# Patient Record
Sex: Female | Born: 1965 | Race: White | Hispanic: Yes | Marital: Single | Smoking: Former smoker
Health system: Southern US, Community
[De-identification: ages and names within clinical notes are randomized; demographics above are authoritative.]

## PROBLEM LIST (undated history)

## (undated) DIAGNOSIS — F419 Anxiety disorder, unspecified: Secondary | ICD-10-CM

## (undated) DIAGNOSIS — I10 Essential (primary) hypertension: Secondary | ICD-10-CM

## (undated) DIAGNOSIS — F509 Eating disorder, unspecified: Secondary | ICD-10-CM

## (undated) DIAGNOSIS — G43909 Migraine, unspecified, not intractable, without status migrainosus: Secondary | ICD-10-CM

## (undated) DIAGNOSIS — F319 Bipolar disorder, unspecified: Secondary | ICD-10-CM

## (undated) DIAGNOSIS — M5416 Radiculopathy, lumbar region: Secondary | ICD-10-CM

## (undated) HISTORY — PX: OTHER SURGICAL HISTORY: SHX169

## (undated) HISTORY — PX: ABDOMINAL HYSTERECTOMY: SHX81

## (undated) SURGERY — Surgical Case
Anesthesia: *Unknown

---

## 1997-12-24 ENCOUNTER — Emergency Department (HOSPITAL_COMMUNITY): Admission: EM | Admit: 1997-12-24 | Discharge: 1997-12-24 | Payer: Self-pay | Admitting: Emergency Medicine

## 1999-01-03 ENCOUNTER — Emergency Department (HOSPITAL_COMMUNITY): Admission: EM | Admit: 1999-01-03 | Discharge: 1999-01-03 | Payer: Self-pay | Admitting: Emergency Medicine

## 1999-01-03 ENCOUNTER — Encounter: Payer: Self-pay | Admitting: Emergency Medicine

## 1999-05-09 ENCOUNTER — Encounter: Payer: Self-pay | Admitting: Emergency Medicine

## 1999-05-09 ENCOUNTER — Emergency Department (HOSPITAL_COMMUNITY): Admission: EM | Admit: 1999-05-09 | Discharge: 1999-05-09 | Payer: Self-pay | Admitting: Emergency Medicine

## 1999-11-12 ENCOUNTER — Emergency Department (HOSPITAL_COMMUNITY): Admission: EM | Admit: 1999-11-12 | Discharge: 1999-11-12 | Payer: Self-pay | Admitting: Emergency Medicine

## 1999-11-12 ENCOUNTER — Encounter: Payer: Self-pay | Admitting: Emergency Medicine

## 1999-11-22 ENCOUNTER — Emergency Department (HOSPITAL_COMMUNITY): Admission: EM | Admit: 1999-11-22 | Discharge: 1999-11-22 | Payer: Self-pay | Admitting: Emergency Medicine

## 1999-12-30 ENCOUNTER — Emergency Department (HOSPITAL_COMMUNITY): Admission: EM | Admit: 1999-12-30 | Discharge: 1999-12-30 | Payer: Self-pay | Admitting: Emergency Medicine

## 2000-02-07 ENCOUNTER — Emergency Department (HOSPITAL_COMMUNITY): Admission: EM | Admit: 2000-02-07 | Discharge: 2000-02-07 | Payer: Self-pay | Admitting: Podiatry

## 2000-02-07 ENCOUNTER — Encounter: Payer: Self-pay | Admitting: *Deleted

## 2000-02-25 ENCOUNTER — Emergency Department (HOSPITAL_COMMUNITY): Admission: EM | Admit: 2000-02-25 | Discharge: 2000-02-25 | Payer: Self-pay | Admitting: Emergency Medicine

## 2000-02-26 ENCOUNTER — Emergency Department (HOSPITAL_COMMUNITY): Admission: EM | Admit: 2000-02-26 | Discharge: 2000-02-26 | Payer: Self-pay | Admitting: Emergency Medicine

## 2000-02-26 ENCOUNTER — Encounter: Payer: Self-pay | Admitting: Emergency Medicine

## 2000-10-27 ENCOUNTER — Encounter: Admission: RE | Admit: 2000-10-27 | Discharge: 2000-10-27 | Payer: Self-pay | Admitting: Family Medicine

## 2000-10-27 ENCOUNTER — Other Ambulatory Visit: Admission: RE | Admit: 2000-10-27 | Discharge: 2000-10-27 | Payer: Self-pay | Admitting: Family Medicine

## 2000-11-10 ENCOUNTER — Encounter: Payer: Self-pay | Admitting: Otolaryngology

## 2000-11-10 ENCOUNTER — Encounter: Admission: RE | Admit: 2000-11-10 | Discharge: 2000-11-10 | Payer: Self-pay | Admitting: Otolaryngology

## 2000-11-12 ENCOUNTER — Encounter: Admission: RE | Admit: 2000-11-12 | Discharge: 2000-11-12 | Payer: Self-pay | Admitting: Family Medicine

## 2000-12-21 ENCOUNTER — Encounter: Payer: Self-pay | Admitting: Neurosurgery

## 2000-12-21 ENCOUNTER — Ambulatory Visit (HOSPITAL_COMMUNITY): Admission: RE | Admit: 2000-12-21 | Discharge: 2000-12-21 | Payer: Self-pay | Admitting: Neurosurgery

## 2001-01-06 ENCOUNTER — Encounter: Payer: Self-pay | Admitting: *Deleted

## 2001-01-08 ENCOUNTER — Inpatient Hospital Stay (HOSPITAL_COMMUNITY): Admission: RE | Admit: 2001-01-08 | Discharge: 2001-01-09 | Payer: Self-pay | Admitting: Otolaryngology

## 2001-01-08 ENCOUNTER — Encounter (INDEPENDENT_AMBULATORY_CARE_PROVIDER_SITE_OTHER): Payer: Self-pay | Admitting: Specialist

## 2001-01-30 ENCOUNTER — Encounter: Admission: RE | Admit: 2001-01-30 | Discharge: 2001-01-30 | Payer: Self-pay | Admitting: Family Medicine

## 2001-04-26 ENCOUNTER — Ambulatory Visit (HOSPITAL_COMMUNITY): Admission: RE | Admit: 2001-04-26 | Discharge: 2001-04-26 | Payer: Self-pay | Admitting: Neurological Surgery

## 2001-04-26 ENCOUNTER — Encounter: Payer: Self-pay | Admitting: Neurological Surgery

## 2001-06-04 ENCOUNTER — Encounter: Admission: RE | Admit: 2001-06-04 | Discharge: 2001-06-04 | Payer: Self-pay | Admitting: Neurosurgery

## 2001-06-04 ENCOUNTER — Encounter: Payer: Self-pay | Admitting: Neurosurgery

## 2001-06-17 ENCOUNTER — Encounter: Admission: RE | Admit: 2001-06-17 | Discharge: 2001-06-17 | Payer: Self-pay | Admitting: Neurosurgery

## 2001-06-17 ENCOUNTER — Encounter: Payer: Self-pay | Admitting: Neurosurgery

## 2001-09-29 ENCOUNTER — Encounter: Admission: RE | Admit: 2001-09-29 | Discharge: 2001-09-29 | Payer: Self-pay | Admitting: Sports Medicine

## 2001-10-28 ENCOUNTER — Emergency Department (HOSPITAL_COMMUNITY): Admission: EM | Admit: 2001-10-28 | Discharge: 2001-10-29 | Payer: Self-pay | Admitting: Emergency Medicine

## 2001-10-29 ENCOUNTER — Encounter: Payer: Self-pay | Admitting: Emergency Medicine

## 2001-11-05 ENCOUNTER — Inpatient Hospital Stay (HOSPITAL_COMMUNITY): Admission: EM | Admit: 2001-11-05 | Discharge: 2001-11-11 | Payer: Self-pay | Admitting: Psychiatry

## 2001-12-02 ENCOUNTER — Encounter: Admission: RE | Admit: 2001-12-02 | Discharge: 2001-12-02 | Payer: Self-pay | Admitting: Radiology

## 2002-01-27 ENCOUNTER — Emergency Department (HOSPITAL_COMMUNITY): Admission: EM | Admit: 2002-01-27 | Discharge: 2002-01-27 | Payer: Self-pay | Admitting: Emergency Medicine

## 2002-05-24 ENCOUNTER — Encounter: Admission: RE | Admit: 2002-05-24 | Discharge: 2002-05-24 | Payer: Self-pay | Admitting: Family Medicine

## 2002-05-25 ENCOUNTER — Emergency Department (HOSPITAL_COMMUNITY): Admission: EM | Admit: 2002-05-25 | Discharge: 2002-05-26 | Payer: Self-pay | Admitting: Emergency Medicine

## 2002-06-15 ENCOUNTER — Encounter: Payer: Self-pay | Admitting: Emergency Medicine

## 2002-06-15 ENCOUNTER — Emergency Department (HOSPITAL_COMMUNITY): Admission: EM | Admit: 2002-06-15 | Discharge: 2002-06-15 | Payer: Self-pay | Admitting: Emergency Medicine

## 2002-06-23 ENCOUNTER — Emergency Department (HOSPITAL_COMMUNITY): Admission: EM | Admit: 2002-06-23 | Discharge: 2002-06-23 | Payer: Self-pay | Admitting: *Deleted

## 2002-09-21 ENCOUNTER — Other Ambulatory Visit: Admission: RE | Admit: 2002-09-21 | Discharge: 2002-09-21 | Payer: Self-pay | Admitting: Family Medicine

## 2002-09-21 ENCOUNTER — Encounter: Admission: RE | Admit: 2002-09-21 | Discharge: 2002-09-21 | Payer: Self-pay | Admitting: Family Medicine

## 2002-09-21 ENCOUNTER — Encounter (INDEPENDENT_AMBULATORY_CARE_PROVIDER_SITE_OTHER): Payer: Self-pay | Admitting: *Deleted

## 2002-09-22 ENCOUNTER — Encounter: Admission: RE | Admit: 2002-09-22 | Discharge: 2002-09-22 | Payer: Self-pay | Admitting: Family Medicine

## 2002-10-07 ENCOUNTER — Encounter: Admission: RE | Admit: 2002-10-07 | Discharge: 2002-10-07 | Payer: Self-pay | Admitting: Family Medicine

## 2003-05-23 ENCOUNTER — Encounter: Admission: RE | Admit: 2003-05-23 | Discharge: 2003-05-23 | Payer: Self-pay | Admitting: Family Medicine

## 2003-06-15 ENCOUNTER — Encounter: Admission: RE | Admit: 2003-06-15 | Discharge: 2003-06-15 | Payer: Self-pay | Admitting: Family Medicine

## 2004-02-10 ENCOUNTER — Ambulatory Visit (HOSPITAL_COMMUNITY): Admission: RE | Admit: 2004-02-10 | Discharge: 2004-02-10 | Payer: Self-pay | Admitting: Neurosurgery

## 2004-06-28 ENCOUNTER — Ambulatory Visit: Payer: Self-pay | Admitting: Family Medicine

## 2004-07-04 ENCOUNTER — Emergency Department (HOSPITAL_COMMUNITY): Admission: EM | Admit: 2004-07-04 | Discharge: 2004-07-04 | Payer: Self-pay | Admitting: Family Medicine

## 2004-08-08 ENCOUNTER — Encounter: Admission: RE | Admit: 2004-08-08 | Discharge: 2004-08-08 | Payer: Self-pay | Admitting: Orthopaedic Surgery

## 2004-08-21 ENCOUNTER — Encounter: Admission: RE | Admit: 2004-08-21 | Discharge: 2004-08-21 | Payer: Self-pay | Admitting: Orthopaedic Surgery

## 2004-10-04 ENCOUNTER — Emergency Department (HOSPITAL_COMMUNITY): Admission: EM | Admit: 2004-10-04 | Discharge: 2004-10-04 | Payer: Self-pay | Admitting: Emergency Medicine

## 2004-11-14 ENCOUNTER — Encounter
Admission: RE | Admit: 2004-11-14 | Discharge: 2005-02-12 | Payer: Self-pay | Admitting: Physical Medicine & Rehabilitation

## 2004-11-16 ENCOUNTER — Ambulatory Visit: Payer: Self-pay | Admitting: Physical Medicine & Rehabilitation

## 2004-12-24 ENCOUNTER — Other Ambulatory Visit: Admission: RE | Admit: 2004-12-24 | Discharge: 2004-12-24 | Payer: Self-pay | Admitting: Family Medicine

## 2004-12-24 ENCOUNTER — Ambulatory Visit: Payer: Self-pay | Admitting: Sports Medicine

## 2005-01-03 ENCOUNTER — Ambulatory Visit: Payer: Self-pay | Admitting: Family Medicine

## 2005-01-11 ENCOUNTER — Ambulatory Visit: Payer: Self-pay | Admitting: Family Medicine

## 2005-03-13 ENCOUNTER — Ambulatory Visit: Payer: Self-pay | Admitting: Physical Medicine & Rehabilitation

## 2005-03-13 ENCOUNTER — Encounter
Admission: RE | Admit: 2005-03-13 | Discharge: 2005-06-11 | Payer: Self-pay | Admitting: Physical Medicine & Rehabilitation

## 2005-06-07 ENCOUNTER — Ambulatory Visit: Payer: Self-pay | Admitting: Physical Medicine & Rehabilitation

## 2005-06-07 ENCOUNTER — Ambulatory Visit: Payer: Self-pay | Admitting: Sports Medicine

## 2005-09-07 ENCOUNTER — Encounter
Admission: RE | Admit: 2005-09-07 | Discharge: 2005-12-06 | Payer: Self-pay | Admitting: Physical Medicine & Rehabilitation

## 2005-10-07 ENCOUNTER — Ambulatory Visit: Payer: Self-pay | Admitting: Physical Medicine & Rehabilitation

## 2006-01-20 ENCOUNTER — Encounter: Admission: RE | Admit: 2006-01-20 | Discharge: 2006-01-20 | Payer: Self-pay | Admitting: Sports Medicine

## 2006-01-24 ENCOUNTER — Ambulatory Visit: Payer: Self-pay | Admitting: Physical Medicine & Rehabilitation

## 2006-01-24 ENCOUNTER — Encounter
Admission: RE | Admit: 2006-01-24 | Discharge: 2006-04-24 | Payer: Self-pay | Admitting: Physical Medicine & Rehabilitation

## 2006-05-22 ENCOUNTER — Encounter
Admission: RE | Admit: 2006-05-22 | Discharge: 2006-08-20 | Payer: Self-pay | Admitting: Physical Medicine & Rehabilitation

## 2006-06-06 ENCOUNTER — Ambulatory Visit: Payer: Self-pay | Admitting: Physical Medicine & Rehabilitation

## 2006-07-09 ENCOUNTER — Ambulatory Visit: Payer: Self-pay | Admitting: Family Medicine

## 2006-07-09 ENCOUNTER — Ambulatory Visit (HOSPITAL_COMMUNITY): Admission: RE | Admit: 2006-07-09 | Discharge: 2006-07-09 | Payer: Self-pay | Admitting: Family Medicine

## 2006-07-31 ENCOUNTER — Ambulatory Visit: Payer: Self-pay | Admitting: Sports Medicine

## 2006-09-19 ENCOUNTER — Encounter (INDEPENDENT_AMBULATORY_CARE_PROVIDER_SITE_OTHER): Payer: Self-pay | Admitting: *Deleted

## 2006-09-19 LAB — CONVERTED CEMR LAB

## 2006-10-03 ENCOUNTER — Encounter (INDEPENDENT_AMBULATORY_CARE_PROVIDER_SITE_OTHER): Payer: Self-pay | Admitting: Family Medicine

## 2006-10-03 ENCOUNTER — Other Ambulatory Visit: Admission: RE | Admit: 2006-10-03 | Discharge: 2006-10-03 | Payer: Self-pay | Admitting: Family Medicine

## 2006-10-03 ENCOUNTER — Ambulatory Visit: Payer: Self-pay | Admitting: Family Medicine

## 2006-10-03 LAB — CONVERTED CEMR LAB
Chlamydia, DNA Probe: NEGATIVE
GC Probe Amp, Genital: NEGATIVE

## 2006-10-16 DIAGNOSIS — F17200 Nicotine dependence, unspecified, uncomplicated: Secondary | ICD-10-CM | POA: Insufficient documentation

## 2006-10-16 DIAGNOSIS — I10 Essential (primary) hypertension: Secondary | ICD-10-CM

## 2006-10-16 DIAGNOSIS — F319 Bipolar disorder, unspecified: Secondary | ICD-10-CM | POA: Insufficient documentation

## 2006-10-17 ENCOUNTER — Encounter (INDEPENDENT_AMBULATORY_CARE_PROVIDER_SITE_OTHER): Payer: Self-pay | Admitting: *Deleted

## 2006-10-21 ENCOUNTER — Encounter: Admission: RE | Admit: 2006-10-21 | Discharge: 2006-10-21 | Payer: Self-pay | Admitting: Sports Medicine

## 2006-12-01 ENCOUNTER — Ambulatory Visit: Payer: Self-pay | Admitting: Physical Medicine & Rehabilitation

## 2006-12-01 ENCOUNTER — Encounter
Admission: RE | Admit: 2006-12-01 | Discharge: 2007-03-01 | Payer: Self-pay | Admitting: Physical Medicine & Rehabilitation

## 2007-03-24 ENCOUNTER — Ambulatory Visit: Payer: Self-pay | Admitting: Physical Medicine & Rehabilitation

## 2007-03-24 ENCOUNTER — Encounter
Admission: RE | Admit: 2007-03-24 | Discharge: 2007-05-19 | Payer: Self-pay | Admitting: Physical Medicine & Rehabilitation

## 2007-05-08 ENCOUNTER — Ambulatory Visit: Payer: Self-pay | Admitting: Family Medicine

## 2007-07-10 ENCOUNTER — Ambulatory Visit: Payer: Self-pay | Admitting: Physical Medicine & Rehabilitation

## 2007-07-10 ENCOUNTER — Encounter
Admission: RE | Admit: 2007-07-10 | Discharge: 2007-07-14 | Payer: Self-pay | Admitting: Physical Medicine & Rehabilitation

## 2007-08-26 ENCOUNTER — Ambulatory Visit: Payer: Self-pay | Admitting: Family Medicine

## 2007-09-07 ENCOUNTER — Ambulatory Visit: Payer: Self-pay | Admitting: Family Medicine

## 2007-09-29 ENCOUNTER — Encounter: Payer: Self-pay | Admitting: *Deleted

## 2007-10-01 ENCOUNTER — Ambulatory Visit: Payer: Self-pay | Admitting: Family Medicine

## 2007-11-02 ENCOUNTER — Encounter
Admission: RE | Admit: 2007-11-02 | Discharge: 2008-01-31 | Payer: Self-pay | Admitting: Physical Medicine & Rehabilitation

## 2007-11-09 ENCOUNTER — Encounter (INDEPENDENT_AMBULATORY_CARE_PROVIDER_SITE_OTHER): Payer: Self-pay | Admitting: Family Medicine

## 2007-11-09 ENCOUNTER — Ambulatory Visit: Payer: Self-pay | Admitting: Sports Medicine

## 2007-11-09 ENCOUNTER — Other Ambulatory Visit: Admission: RE | Admit: 2007-11-09 | Discharge: 2007-11-09 | Payer: Self-pay | Admitting: Family Medicine

## 2007-11-09 LAB — CONVERTED CEMR LAB
ALT: 15 units/L (ref 0–35)
AST: 15 units/L (ref 0–37)
Albumin: 4.7 g/dL (ref 3.5–5.2)
Alkaline Phosphatase: 58 units/L (ref 39–117)
BUN: 15 mg/dL (ref 6–23)
Beta hcg, urine, semiquantitative: NEGATIVE
CO2: 21 meq/L (ref 19–32)
Calcium: 9.2 mg/dL (ref 8.4–10.5)
Chloride: 104 meq/L (ref 96–112)
Cholesterol: 186 mg/dL (ref 0–200)
Creatinine, Ser: 0.95 mg/dL (ref 0.40–1.20)
Glucose, Bld: 93 mg/dL (ref 70–99)
HCT: 39.7 % (ref 36.0–46.0)
HDL: 66 mg/dL (ref >39)
Hemoglobin: 14.3 g/dL (ref 12.0–15.0)
LDL Cholesterol: 100 mg/dL — ABNORMAL HIGH (ref 0–99)
MCHC: 36 g/dL (ref 30.0–36.0)
MCV: 93.4 fL (ref 78.0–100.0)
Platelets: 262 10*3/uL (ref 150–400)
Potassium: 3.9 meq/L (ref 3.5–5.3)
RBC: 4.25 M/uL (ref 3.87–5.11)
RDW: 12.7 % (ref 11.5–15.5)
Sodium: 138 meq/L (ref 135–145)
TSH: 1.309 microintl units/mL (ref 0.350–5.50)
Total Bilirubin: 0.5 mg/dL (ref 0.3–1.2)
Total CHOL/HDL Ratio: 2.8
Total Protein: 7.1 g/dL (ref 6.0–8.3)
Triglycerides: 100 mg/dL (ref <150)
VLDL: 20 mg/dL (ref 0–40)
WBC: 9 10*3/uL (ref 4.0–10.5)

## 2007-11-10 ENCOUNTER — Encounter (INDEPENDENT_AMBULATORY_CARE_PROVIDER_SITE_OTHER): Payer: Self-pay | Admitting: Family Medicine

## 2007-11-11 ENCOUNTER — Encounter (INDEPENDENT_AMBULATORY_CARE_PROVIDER_SITE_OTHER): Payer: Self-pay | Admitting: Family Medicine

## 2007-11-26 ENCOUNTER — Ambulatory Visit: Payer: Self-pay | Admitting: Family Medicine

## 2007-11-26 ENCOUNTER — Telehealth (INDEPENDENT_AMBULATORY_CARE_PROVIDER_SITE_OTHER): Payer: Self-pay | Admitting: Family Medicine

## 2007-12-06 ENCOUNTER — Emergency Department (HOSPITAL_COMMUNITY): Admission: EM | Admit: 2007-12-06 | Discharge: 2007-12-06 | Payer: Self-pay | Admitting: Emergency Medicine

## 2007-12-09 ENCOUNTER — Encounter: Payer: Self-pay | Admitting: *Deleted

## 2007-12-10 ENCOUNTER — Ambulatory Visit: Payer: Self-pay | Admitting: Physical Medicine & Rehabilitation

## 2007-12-15 ENCOUNTER — Encounter: Payer: Self-pay | Admitting: Family Medicine

## 2008-03-21 ENCOUNTER — Encounter (INDEPENDENT_AMBULATORY_CARE_PROVIDER_SITE_OTHER): Payer: Self-pay | Admitting: *Deleted

## 2008-03-24 ENCOUNTER — Ambulatory Visit: Payer: Self-pay | Admitting: Family Medicine

## 2008-03-24 ENCOUNTER — Encounter
Admission: RE | Admit: 2008-03-24 | Discharge: 2008-03-28 | Payer: Self-pay | Admitting: Physical Medicine & Rehabilitation

## 2008-03-28 ENCOUNTER — Ambulatory Visit: Payer: Self-pay | Admitting: Physical Medicine & Rehabilitation

## 2008-04-19 ENCOUNTER — Encounter (INDEPENDENT_AMBULATORY_CARE_PROVIDER_SITE_OTHER): Payer: Self-pay | Admitting: *Deleted

## 2008-05-26 ENCOUNTER — Encounter: Payer: Self-pay | Admitting: Family Medicine

## 2008-05-26 ENCOUNTER — Ambulatory Visit: Payer: Self-pay | Admitting: Family Medicine

## 2008-05-26 ENCOUNTER — Other Ambulatory Visit: Admission: RE | Admit: 2008-05-26 | Discharge: 2008-05-26 | Payer: Self-pay | Admitting: Family Medicine

## 2008-05-26 DIAGNOSIS — F1910 Other psychoactive substance abuse, uncomplicated: Secondary | ICD-10-CM | POA: Insufficient documentation

## 2008-05-31 ENCOUNTER — Encounter: Payer: Self-pay | Admitting: Family Medicine

## 2008-05-31 LAB — CONVERTED CEMR LAB: Pap Smear: NORMAL

## 2008-07-13 ENCOUNTER — Encounter
Admission: RE | Admit: 2008-07-13 | Discharge: 2008-07-18 | Payer: Self-pay | Admitting: Physical Medicine & Rehabilitation

## 2008-07-18 ENCOUNTER — Ambulatory Visit: Payer: Self-pay | Admitting: Physical Medicine & Rehabilitation

## 2008-08-29 ENCOUNTER — Other Ambulatory Visit: Payer: Self-pay

## 2008-08-30 ENCOUNTER — Inpatient Hospital Stay (HOSPITAL_COMMUNITY): Admission: AD | Admit: 2008-08-30 | Discharge: 2008-09-04 | Payer: Self-pay | Admitting: Psychiatry

## 2008-08-30 ENCOUNTER — Ambulatory Visit: Payer: Self-pay | Admitting: Psychiatry

## 2008-09-07 ENCOUNTER — Emergency Department (HOSPITAL_COMMUNITY): Admission: EM | Admit: 2008-09-07 | Discharge: 2008-09-07 | Payer: Self-pay | Admitting: Emergency Medicine

## 2008-09-07 ENCOUNTER — Encounter: Payer: Self-pay | Admitting: Family Medicine

## 2008-09-16 ENCOUNTER — Ambulatory Visit: Payer: Self-pay | Admitting: Family Medicine

## 2008-12-28 ENCOUNTER — Telehealth: Payer: Self-pay | Admitting: *Deleted

## 2009-01-24 ENCOUNTER — Ambulatory Visit: Payer: Self-pay | Admitting: Family Medicine

## 2009-02-06 ENCOUNTER — Telehealth: Payer: Self-pay | Admitting: *Deleted

## 2009-02-13 ENCOUNTER — Encounter: Admission: RE | Admit: 2009-02-13 | Discharge: 2009-02-13 | Payer: Self-pay | Admitting: Orthopaedic Surgery

## 2009-02-22 ENCOUNTER — Encounter: Admission: RE | Admit: 2009-02-22 | Discharge: 2009-03-23 | Payer: Self-pay | Admitting: Orthopaedic Surgery

## 2009-03-06 ENCOUNTER — Encounter: Admission: RE | Admit: 2009-03-06 | Discharge: 2009-03-06 | Payer: Self-pay | Admitting: Orthopaedic Surgery

## 2009-03-09 ENCOUNTER — Encounter: Payer: Self-pay | Admitting: *Deleted

## 2009-05-12 ENCOUNTER — Encounter: Admission: RE | Admit: 2009-05-12 | Discharge: 2009-05-12 | Payer: Self-pay | Admitting: Orthopaedic Surgery

## 2009-05-17 ENCOUNTER — Encounter: Admission: RE | Admit: 2009-05-17 | Discharge: 2009-05-17 | Payer: Self-pay | Admitting: Orthopaedic Surgery

## 2009-06-28 ENCOUNTER — Encounter: Admission: RE | Admit: 2009-06-28 | Discharge: 2009-06-28 | Payer: Self-pay | Admitting: Family Medicine

## 2009-07-11 ENCOUNTER — Encounter: Payer: Self-pay | Admitting: Family Medicine

## 2009-07-11 ENCOUNTER — Ambulatory Visit: Payer: Self-pay | Admitting: Family Medicine

## 2009-07-11 LAB — CONVERTED CEMR LAB
Glucose, Urine, Semiquant: NEGATIVE
Nitrite: NEGATIVE
Urobilinogen, UA: 0.2
WBC Urine, dipstick: NEGATIVE

## 2009-07-17 LAB — CONVERTED CEMR LAB
Alkaline Phosphatase: 55 units/L (ref 39–117)
BUN: 8 mg/dL (ref 6–23)
Basophils Absolute: 0.1 10*3/uL (ref 0.0–0.1)
Basophils Relative: 1 % (ref 0–1)
CRP: 0 mg/dL (ref <0.6)
Creatinine, Ser: 0.83 mg/dL (ref 0.40–1.20)
Glucose, Bld: 79 mg/dL (ref 70–99)
MCHC: 35 g/dL (ref 30.0–36.0)
Monocytes Relative: 6 % (ref 3–12)
Neutro Abs: 4.1 10*3/uL (ref 1.7–7.7)
Neutrophils Relative %: 59 % (ref 43–77)
Prothrombin Time: 12.9 s (ref 11.6–15.2)
RBC: 3.81 M/uL — ABNORMAL LOW (ref 3.87–5.11)
Total Bilirubin: 0.3 mg/dL (ref 0.3–1.2)

## 2009-07-20 ENCOUNTER — Ambulatory Visit: Payer: Self-pay | Admitting: Family Medicine

## 2009-07-20 ENCOUNTER — Encounter: Payer: Self-pay | Admitting: Family Medicine

## 2009-07-24 LAB — CONVERTED CEMR LAB
Anti Nuclear Antibody(ANA): NEGATIVE
TSH: 1.244 microintl units/mL (ref 0.350–4.500)

## 2009-09-06 ENCOUNTER — Ambulatory Visit: Payer: Self-pay | Admitting: Family Medicine

## 2009-09-06 DIAGNOSIS — M5126 Other intervertebral disc displacement, lumbar region: Secondary | ICD-10-CM

## 2009-09-06 DIAGNOSIS — G43909 Migraine, unspecified, not intractable, without status migrainosus: Secondary | ICD-10-CM | POA: Insufficient documentation

## 2009-09-07 ENCOUNTER — Encounter (INDEPENDENT_AMBULATORY_CARE_PROVIDER_SITE_OTHER): Payer: Self-pay | Admitting: *Deleted

## 2009-09-11 ENCOUNTER — Telehealth (INDEPENDENT_AMBULATORY_CARE_PROVIDER_SITE_OTHER): Payer: Self-pay | Admitting: *Deleted

## 2009-09-12 ENCOUNTER — Telehealth: Payer: Self-pay | Admitting: Family Medicine

## 2009-09-15 ENCOUNTER — Encounter: Payer: Self-pay | Admitting: *Deleted

## 2009-10-04 ENCOUNTER — Telehealth: Payer: Self-pay | Admitting: Family Medicine

## 2009-10-11 ENCOUNTER — Emergency Department (HOSPITAL_COMMUNITY): Admission: EM | Admit: 2009-10-11 | Discharge: 2009-10-11 | Payer: Self-pay | Admitting: Family Medicine

## 2009-10-27 ENCOUNTER — Ambulatory Visit: Payer: Self-pay | Admitting: Family Medicine

## 2009-11-28 ENCOUNTER — Ambulatory Visit: Payer: Self-pay | Admitting: Family Medicine

## 2009-12-27 ENCOUNTER — Ambulatory Visit: Payer: Self-pay | Admitting: Family Medicine

## 2009-12-27 ENCOUNTER — Encounter: Payer: Self-pay | Admitting: Psychology

## 2010-01-10 ENCOUNTER — Encounter: Payer: Self-pay | Admitting: Family Medicine

## 2010-01-25 ENCOUNTER — Encounter: Payer: Self-pay | Admitting: Family Medicine

## 2010-03-05 ENCOUNTER — Ambulatory Visit: Payer: Self-pay | Admitting: Family Medicine

## 2010-04-04 ENCOUNTER — Ambulatory Visit: Payer: Self-pay | Admitting: Family Medicine

## 2010-04-21 ENCOUNTER — Encounter: Payer: Self-pay | Admitting: Family Medicine

## 2010-04-21 DIAGNOSIS — J45909 Unspecified asthma, uncomplicated: Secondary | ICD-10-CM | POA: Insufficient documentation

## 2010-06-29 ENCOUNTER — Encounter: Admission: RE | Admit: 2010-06-29 | Discharge: 2010-06-29 | Payer: Self-pay | Admitting: Sports Medicine

## 2010-07-18 ENCOUNTER — Encounter: Payer: Self-pay | Admitting: Family Medicine

## 2010-09-09 ENCOUNTER — Encounter: Payer: Self-pay | Admitting: Neurosurgery

## 2010-09-16 ENCOUNTER — Encounter: Payer: Self-pay | Admitting: Family Medicine

## 2010-09-16 NOTE — Progress Notes (Unsigned)
    There were no vitals taken for this visit. Pelvic Exam: {pelvic exam:16852::"cervix normal in appearance","external genitalia normal","vagina normal without discharge"}. Pap smear obtained.   Assessment:    Screening pap smear.   Plan:   Follow up in {1-10:13787::"1"} {times; unit:10146::"year"}, or as indicated by Pap results.   Patient ID: ,     DOB: ,    MRN:   HPI Connie Black is a 45 y.o. woman who comes in today for a  pap smear only. Her most recent annual exam was on ***. Her most recent Pap smear was on *** and showed {pap results:16707::"no abnormalities"}. Previous abnormal Pap smears: {yes***/no:17258}. Contraception: {method:5051}  {Common ambulatory SmartLinks:19316}   Patient is here for Pap smear.    Review of Systems  HENT: Positive for hearing loss, neck stiffness and ear discharge.        Objective:   Physical Exam  Cardiovascular:  No murmur heard. Pulmonary/Chest: She has rales. She exhibits no tenderness.     Assessment & Plan:

## 2010-09-19 ENCOUNTER — Ambulatory Visit: Payer: Self-pay | Admitting: Family Medicine

## 2010-09-19 ENCOUNTER — Ambulatory Visit: Admit: 2010-09-19 | Payer: Self-pay

## 2010-09-20 NOTE — Miscellaneous (Signed)
Summary: Problem list     Past History:  Past Medical History: Asthma triggers: pollen, chemical odors, weather,  Past problems: -H/o chlamydia, -H/o cocaine & alcohol addiction, -H/o GERD  -Herniated disk L5/S1,  -Hypokalemia likely due to HCTZ 25 mg daily--pt has, increased potassium in diet w/ resolution(5/06) PSYCH  -Pt reports h/o schizophrenia, depression also  -h/o dissociative identity disorder  -H/o bulemia  -H/o multiple suicide attempts (x 5),  -H/o self-mutilation,   -self-admit to [facility] 1/10 x 1 week -h/o constipation (12/2009) -h/o insomnia (04/2007) -h/o low back pain (09/2006)

## 2010-09-20 NOTE — Progress Notes (Signed)
Summary: phnmsg  Phone Note Call from Patient Call back at Home Phone [phone]   Caller: Patient Summary of Call: wants to know about referral Initial call taken by: De Nurse,  October 04, 2009 1:40 PM  Follow-up for Phone Call         [facility] declined to see patient . will forward message to MD. Follow-up by: Theresia Lo RN,  October 04, 2009 1:49 PM  Additional Follow-up for Phone Call Additional follow up Details #1::        please call pt to advise. thanks.  Additional Follow-up by: Myrtie Soman  MD,  October 04, 2009 2:31 PM    Additional Follow-up for Phone Call Additional follow up Details #2::    patient notified. She will call back  to schedule appointment with Dr. Rexene Alberts Follow-up by: Theresia Lo RN,  October 04, 2009 5:01 PM

## 2010-09-20 NOTE — Assessment & Plan Note (Signed)
Summary: Behavioral Medicine Student Consultation   Primary Care Provider:  Myrtie Soman  MD   History of Present Illness: Patient spoke with behavioral medicine student Edison Pace) regarding smoking cessation.  Patient currently smokes approx. 2 cigarettes per day.  In the past she she smoked 1/2 to 1 pack per day.  Patient reported that she smokes, primarily, as a means of relieving stress and to suppress weight gain.  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  She reported seeing a therapist 2 times/week until recently (Medicaid stopped paying) to work on problems associated with her diagnosis of dissociative identity disorder.  She plans to start seeing her therapist again soon.    Allergies: 1)  ! Morphine   Impression & Recommendations:  Problem # 1:  TOBACCO DEPENDENCE (ICD-305.1) Patient appears to be in the contemplative stage of change regarding smoking cessation.  She was able to identify both benefits (e.g., asthma, wants to be able to play with grandchildren) and barriers (e.g., smoking is stress reducer, weight gain) to stopping smoking.  She has taken action in that she has significantly reduced the daily amount that she smokes; however, she said that the amount she smokes typically increases again in the summer.  Patient was able to identify other means of stress reduction (e.g., writing).  Student suggested that she try some of these alternative means of reducing stress in the future.  Student also suggested that patient talk to her therapist about learning alternative coping techniques, given that patient reported not having learned any previously.  Patient seemed confident that she would try alternative means of stress reduction, but less confident that she would quit smoking entirely.  Student or physician should monitor patient's smoking habits closely over the summer, in particular, given her report of smoking more during this  time of year.  They may also follow-up regarding her efforts to learn alternative coping techniques for stress, anxiety, et Karie Soda.  Armida Sans, Haroldine Laws Behavioral Medicine Student 12/27/2009  Complete Medication List: 1)  Qvar 80 Mcg/act Aers (Beclomethasone dipropionate) .... 2 puffs two times a day for asthma; disp qs for 1 month 2)  Albuterol 90 Mcg/act Aers (Albuterol) .... Inhale 2 puff using inhaler every six hours as needed 3)  Hydrochlorothiazide 25 Mg Tabs (Hydrochlorothiazide) .... Take 1 tablet by mouth once a day 4)  Seroquel 200 Mg Tabs (Quetiapine fumarate) .... 3 tabs by mouth at night 5)  Enalapril Maleate 5 Mg Tabs (Enalapril maleate) .... One by mouth daily 6)  Haldol 2 Mg  .... One by mouth qam 7)  Vistaril 50 Mg Caps (Hydroxyzine pamoate) .... One pill every 6 hours as needed 8)  Topamax 50 Mg Tabs (Topiramate) .... One tab by mouth at night to prevent migraines 9)  Ibuprofen 800 Mg Tabs (Ibuprofen) .... One by mouth every 8 hours as needed for migraine pain; take with food 10)  Flexeril 5 Mg Tabs (Cyclobenzaprine hcl) .... One tab by mouth every 8 hours as needed for migraine pain 11)  Permethrin 5 % Crea (Permethrin) .... Apply chin down at night -- leaving on for 8-12 hours and rinse off the next morning 12)  Miralax Powd (Polyethylene glycol 3350) .Marland KitchenMarland KitchenMarland Kitchen 17 g (1 capful) in 8 oz of water or juice up to every 3 hours as needed for constipation -- begin with two times a day 13)  Natural Fiber Laxative 28.3 % Powd (Psyllium) .... 30 g by mouth daily

## 2010-09-20 NOTE — Progress Notes (Signed)
Summary: Ref Note  Phone Note Other Incoming Call back at [phone]   Caller: [facility] Summary of Call: can't schedule pt due to termination from practice in 05. Initial call taken by: Clydell Hakim,  September 12, 2009 11:17 AM  Follow-up for Phone Call        will try referral to [facility]. records faxed. Follow-up by: Theresia Lo RN,  September 12, 2009 12:00 PM

## 2010-09-20 NOTE — Miscellaneous (Signed)
Summary: re: neurosurg referral  Clinical Lists Changes  received call from [facility] today  and  they decline to see patient. Theresia Lo RN  September 15, 2009 5:34 PM

## 2010-09-20 NOTE — Progress Notes (Signed)
Summary: referral  Phone Note Call from Patient Call back at Home Phone [phone] Call back at [phone]   Caller: Patient Summary of Call: wants to know the status of referral Initial call taken by: De Nurse,  September 11, 2009 2:51 PM  Follow-up for Phone Call        spoke with patient and advised that RN checked on status of appointment today and was told  they were waiting to get her chart from off site location and when it is received they will put it up for MD to review and will call to schedule. Follow-up by: Theresia Lo RN,  September 11, 2009 3:00 PM

## 2010-09-20 NOTE — Miscellaneous (Signed)
  Clinical Lists Changes  Medications: Removed medication of PERMETHRIN 5 % CREA (PERMETHRIN) apply chin down at night -- leaving on for 8-12 hours and rinse off the next morning

## 2010-09-20 NOTE — Assessment & Plan Note (Signed)
Summary: fu/kh   Vital Signs:  Patient profile:   45 year old female Height:      65.5 inches Weight:      105.7 pounds BMI:     17.38 Temp:     98.0 degrees F oral Pulse rate:   94 / minute BP sitting:   118 / 81  (left arm) Cuff size:   regular  Vitals Entered By: Garen Grams LPN (Dec 27, 2009 9:17 AM) CC: f/u migraines Is Patient Diabetic? No Pain Assessment Patient in pain? no        Primary Care Provider:  Myrtie Soman  MD  CC:  f/u migraines.  History of Present Illness: 1. migraines - chronic States that migraines are "gone" with the topamax. Dose was increased to 50 mg once a day at last visit.  States that she still has some numbness and tingling in her hands and feet but this is a very tolerable tradeoff given the improvement  in her headaches.   2. stools States that she typically goes once every 4-5 days but recently has been straining hard to pass her stools. . States that frequency is the same.  States the stools are round and hard. Denies any blood. Is taking sennokot by mouth to help soften her stools but nothing else. States that appetite is normal. Belly does not feel bloated to her.  3. smoking 2 cigs per day. States that she smokes due to stress. Not ready to quit yet.   ROS: chest pain: no   dyspnea: no    focal weakness/numbness: no except as already described    neck stiffness: no   Habits & Providers  Alcohol-Tobacco-Diet     Tobacco Status: never  Current Medications (verified): 1)  Qvar 80 Mcg/act Aers (Beclomethasone Dipropionate) .... 2 Puffs Two Times A Day For Asthma; Disp Qs For 1 Month 2)  Albuterol 90 Mcg/act Aers (Albuterol) .... Inhale 2 Puff Using Inhaler Every Six Hours As Needed 3)  Hydrochlorothiazide 25 Mg Tabs (Hydrochlorothiazide) .... Take 1 Tablet By Mouth Once A Day 4)  Seroquel 200 Mg Tabs (Quetiapine Fumarate) .... 3 Tabs By Mouth At Night 5)  Enalapril Maleate 5 Mg Tabs (Enalapril Maleate) .... One By Mouth  Daily 6)  Haldol 2 Mg .... One By Mouth Qam 7)  Vistaril 50 Mg Caps (Hydroxyzine Pamoate) .... One Pill Every 6 Hours As Needed 8)  Topamax 50 Mg Tabs (Topiramate) .... One Tab By Mouth At Night To Prevent Migraines 9)  Ibuprofen 800 Mg Tabs (Ibuprofen) .... One By Mouth Every 8 Hours As Needed For Migraine Pain; Take With Food 10)  Flexeril 5 Mg Tabs (Cyclobenzaprine Hcl) .... One Tab By Mouth Every 8 Hours As Needed For Migraine Pain 11)  Permethrin 5 % Crea (Permethrin) .... Apply Chin Down At Night -- Leaving On For 8-12 Hours and Rinse Off The Next Morning 12)  Miralax  Powd (Polyethylene Glycol 3350) .Marland KitchenMarland KitchenMarland Kitchen 17 G (1 Capful) in 8 Oz of Water or Juice Up To Every 3 Hours As Needed For Constipation -- Begin With Two Times A Day 13)  Natural Fiber Laxative 28.3 % Powd (Psyllium) .... 30 G By Mouth Daily  Allergies (verified): 1)  ! Morphine  Review of Systems       review of systems as noted in HPI section   Physical Exam  General:  vital signs reviewed; underweight Alert, appropriate; well-dressed and well-nourished  Abdomen:  +BS, soft, non-tender, non-distended; no masses; no rebound  or guarding; negative murphy's; alternating tympany and dullness to percussion.    Impression & Recommendations:  Problem # 1:  MIGRAINE HEADACHE (ICD-346.90) Assessment Improved  improved on topamax 50. continue this. Work on stopping smoking.  Orders: [facility]- Est  Level 4 (02585)  Problem # 2:  CONSTIPATION (ICD-564.00) Assessment: New  initiate bowel regimen. If not improvement in 2-3 days, recommended OTC glycerin suppository .  Her updated medication list for this problem includes:    Miralax Powd (Polyethylene glycol 3350) .Marland KitchenMarland KitchenMarland KitchenMarland Kitchen 17 g (1 capful) in 8 oz of water or juice up to every 3 hours as needed for constipation -- begin with two times a day    Natural Fiber Laxative 28.3 % Powd (Psyllium) .Marland KitchenMarland KitchenMarland KitchenMarland Kitchen 30 g by mouth daily  Orders: [facility]- Est  Level 4 (27782)  Problem # 3:  TOBACCO DEPENDENCE  (ICD-305.1) Assessment: Unchanged  Pt is not ready to quit but agreeable to meet with PsyD candidate Reyne Dumas to discuss further. smoking 2 cigs per day .  Orders: [facility]- Est  Level 4 (99214)  Complete Medication List: 1)  Qvar 80 Mcg/act Aers (Beclomethasone dipropionate) .... 2 puffs two times a day for asthma; disp qs for 1 month 2)  Albuterol 90 Mcg/act Aers (Albuterol) .... Inhale 2 puff using inhaler every six hours as needed 3)  Hydrochlorothiazide 25 Mg Tabs (Hydrochlorothiazide) .... Take 1 tablet by mouth once a day 4)  Seroquel 200 Mg Tabs (Quetiapine fumarate) .... 3 tabs by mouth at night 5)  Enalapril Maleate 5 Mg Tabs (Enalapril maleate) .... One by mouth daily 6)  Haldol 2 Mg  .... One by mouth qam 7)  Vistaril 50 Mg Caps (Hydroxyzine pamoate) .... One pill every 6 hours as needed 8)  Topamax 50 Mg Tabs (Topiramate) .... One tab by mouth at night to prevent migraines 9)  Ibuprofen 800 Mg Tabs (Ibuprofen) .... One by mouth every 8 hours as needed for migraine pain; take with food 10)  Flexeril 5 Mg Tabs (Cyclobenzaprine hcl) .... One tab by mouth every 8 hours as needed for migraine pain 11)  Permethrin 5 % Crea (Permethrin) .... Apply chin down at night -- leaving on for 8-12 hours and rinse off the next morning 12)  Miralax Powd (Polyethylene glycol 3350) .Marland KitchenMarland KitchenMarland Kitchen 17 g (1 capful) in 8 oz of water or juice up to every 3 hours as needed for constipation -- begin with two times a day 13)  Natural Fiber Laxative 28.3 % Powd (Psyllium) .... 30 g by mouth daily  Patient Instructions: 1)  I'm glad your migraines are better...continue the topamax 2)  Start miralax - 1 capful in 8 oz water or juice twice a day. Increase up to every 3 hours until you're getting 1 soft stool every 1-2 days 3)  Increase you fiber intake to 30 g per day (increase slowly over 2 weeks) 4)  If you're not seeing an improvement in the next 2-3 days, obtain an over-the-counter glycerin suppository to help get  you going. 5)  Call for any questions or concerns. 6)  follow-up in 1-2 months. Prescriptions: NATURAL FIBER LAXATIVE 28.3 % POWD (PSYLLIUM) 30 g by mouth daily  #1 x 6   Entered and Authorized by:   Myrtie Soman  MD   Signed by:   Myrtie Soman  MD on 12/27/2009   Method used:   Electronically to        [facility] [phone]* (retail)  [address]       [place], [place]  [address]       Ph: [phone] or [phone]       Fax: [phone]   RxID:   [id number] MIRALAX  POWD (POLYETHYLENE GLYCOL 3350) 17 g (1 capful) in 8 oz of water or juice up to every 3 hours as needed for constipation -- begin with two times a day  #1 x 5   Entered and Authorized by:   Myrtie Soman  MD   Signed by:   Myrtie Soman  MD on 12/27/2009   Method used:   Electronically to        [facility] [address] [phone]* (retail)       [address]       [place], [place]  [address]       Ph: [phone] or [phone]       Fax: [phone]   RxID:   [id number]
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
## 2010-09-20 NOTE — Assessment & Plan Note (Signed)
Summary: f/u visit/[facility]   Vital Signs:  Patient profile:   45 year old female Height:      65.5 inches Weight:      105.8 pounds BMI:     17.40 Temp:     97.8 degrees F oral Pulse rate:   101 / minute BP sitting:   113 / 82  (left arm) Cuff size:   regular  Vitals Entered By: Garen Grams LPN (November 28, 2009 9:02 AM) CC: f.u bp and migraines Is Patient Diabetic? No Pain Assessment Patient in pain? no        Primary Care Provider:  Myrtie Soman  MD  CC:  f.u bp and migraines.  History of Present Illness: 1. migraines - chronic Pt reports that they are much improved but not resolved. States that flexeril and ibuprofen do an acceptable job of aborting her headaches. Has not stopped smoking but has cut back on this.   ** pt notes several days of numbness and tingling in arms and legs but this has resolved -- wonders if it might be due to BP medication   ROS: chest pain: no   dyspnea: no    focal weakness/numbness: no except as already described   neck stiffness: no  2. rash on chest Started yesterday. Fiance with a similar rash for several weeks. His is very itchy, hers is not very itchy. Areas involved are mostly the chest and forearms. Had scabies about 2 months ago. Fiance's rach started soon after her initial episode of scabies.   no fevers or chills  Habits & Providers  Alcohol-Tobacco-Diet     Tobacco Status: never  Allergies: 1)  ! Morphine  Social History: Smoking Status:  never  Physical Exam  General:  vital signs reviewed; underweight Alert, appropriate; well-dressed and well-nourished  Eyes:  EOMI, sclerae clear Neck:  normal ROM, no stiffness; no lymphadenopathy  Lungs:  work of breathing unlabored, clear to auscultation bilaterally; no wheezes, rales, or ronchi; good air movement throughout  Heart:  regular rate and rhythm, no murmurs; normal s1/s2  Skin:  scattered erythematous papular lesion on chest, forwearms and hands; similar in comparison  to fiance's. consistent with scabies. no drainage or discharge; no burrow found.    Impression & Recommendations:  Problem # 1:  MIGRAINE HEADACHE (ICD-346.90) Assessment Unchanged  doing somewhat better but not under great control. numbness/tingling likely attributable to topamax. Pt okay to increase the dose for better control . Advised that she stop smoking completely. Aware that numbness/tingling may return or get worse on higher dose. follow-up in one month.   Her updated medication list for this problem includes:    Ibuprofen 800 Mg Tabs (Ibuprofen) ..... One by mouth every 8 hours as needed for migraine pain; take with food  Orders: [facility]- Est  Level 4 ([address])  Problem # 2:  SCABIES (ICD-133.0) Assessment: New  likely a recurrence (diagnosed with this about 2 months); fiance with similar rash. Permethrin. Appropriate washing of linens/clothes discussed. Handout given. follow-up as needed. no history of immunocompromise.   Orders: [facility]- Est  Level 4 ([address])  Complete Medication List: 1)  Qvar 80 Mcg/act Aers (Beclomethasone dipropionate) .... 2 puffs two times a day for asthma; disp qs for 1 month 2)  Albuterol 90 Mcg/act Aers (Albuterol) .... Inhale 2 puff using inhaler every six hours as needed 3)  Hydrochlorothiazide 25 Mg Tabs (Hydrochlorothiazide) .... Take 1 tablet by mouth once a day 4)  Seroquel 200 Mg Tabs (Quetiapine fumarate) .Marland KitchenMarland KitchenMarland Kitchen  3 tabs by mouth at night 5)  Enalapril Maleate 5 Mg Tabs (Enalapril maleate) .... One by mouth daily 6)  Haldol 2 Mg  .... One by mouth qam 7)  Vistaril 50 Mg Caps (Hydroxyzine pamoate) .... One pill every 6 hours as needed 8)  Topamax 50 Mg Tabs (Topiramate) .... One tab by mouth at night to prevent migraines 9)  Ibuprofen 800 Mg Tabs (Ibuprofen) .... One by mouth every 8 hours as needed for migraine pain; take with food 10)  Flexeril 5 Mg Tabs (Cyclobenzaprine hcl) .... One tab by mouth every 8 hours as needed for migraine pain 11)   Permethrin 5 % Crea (Permethrin) .... Apply chin down at night -- leaving on for 8-12 hours and rinse off the next morning  Patient Instructions: 1)  I think this is scabies; apply the permethrin cream at night. 2)  increase your topamax to 50 mg (2 tablets of 25 mg) then switch to 50 mg. 3)  stop smoking! you can do it.  4)  follow-up in one month. Prescriptions: TOPAMAX 50 MG TABS (TOPIRAMATE) one tab by mouth at night to prevent migraines  #30 x 1   Entered and Authorized by:   Myrtie Soman  MD   Signed by:   Myrtie Soman  MD on 11/28/2009   Method used:   Electronically to        [facility] [phone]* (retail)       [address]       [place], [place]  [address]       Ph: [phone] or [phone]       Fax: [phone]   RxID:   [id number] PERMETHRIN 5 % CREA (PERMETHRIN) apply chin down at night -- leaving on for 8-12 hours and rinse off the next morning  #60 g x 0   Entered and Authorized by:   Myrtie Soman  MD   Signed by:   Myrtie Soman  MD on 11/28/2009   Method used:   Electronically to        [facility] [address] [phone]* (retail)       [address]       [place], [place]  [address]       Ph: [phone] or [phone]       Fax: [phone]   RxID:   [id number]

## 2010-09-20 NOTE — Assessment & Plan Note (Signed)
Summary: migraine/[place]   Vital Signs:  Patient profile:   45 year old female Height:      65.5 inches Weight:      106 pounds BMI:     17.43 Temp:     98.4 degrees F oral Pulse rate:   67 / minute BP sitting:   115 / 85 Cuff size:   regular  Vitals Entered By: Jimmy Footman, CMA (April 04, 2010 10:16 AM) CC: migranes Pain Assessment Patient in pain? yes     Location: head Intensity: 10+   Primary Care Grae Cannata:  Priscella Mann MD  CC:  migranes.  History of Present Illness: Migrained:  worsened since July of this year.  More frequent and more severe outbreaks.  Describes pain as starting in and around her eyes, radiating to temple and back of head.  Has taken Ibuprofen for abortive therapy, but states this beginning to cause GI upset.  On Topamax 50 mg for prevention for past year.  Helped initially but now becoming more frequent.  Pain 10/10 at worst, 8/10 currently.    ROS:  nausea and occasional vomiting when migraines occur.  Often preceded by aura or lightheadedness.  No URI symptoms currently  Habits & Providers  Alcohol-Tobacco-Diet     Tobacco Status: quit  Current Problems (verified): 1)  Constipation  (ICD-564.00) 2)  Migraine Headache  (ICD-346.90) 3)  Herniated Lumbosacral Disc  (ICD-722.10) 4)  Substance Abuse  (ICD-305.90) 5)  Health Maintenance Exam  (ICD-V70.0) 6)  Screening For Malignant Neoplasm of The Cervix  (ICD-V76.2) 7)  Weight Loss, Abnormal  (ICD-783.21) 8)  Bulimia, Hx of  (ICD-V11.8) 9)  Insomnia  (ICD-780.52) 10)  Tobacco Dependence  (ICD-305.1) 11)  Hypertension, Benign Systemic  (ICD-401.1) 12)  Bipolar Disorder  (ICD-296.7) 13)  Back Pain, Low  (ICD-724.2) 14)  Asthma, Unspecified  (ICD-493.90)  Current Medications (verified): 1)  Qvar 80 Mcg/act Aers (Beclomethasone Dipropionate) .... 2 Puffs Two Times A Day For Asthma; Disp Qs For 1 Month 2)  Albuterol 90 Mcg/act Aers (Albuterol) .... Inhale 2 Puff Using Inhaler Every Six  Hours As Needed 3)  Hydrochlorothiazide 25 Mg Tabs (Hydrochlorothiazide) .... Take 1 Tablet By Mouth Once A Day 4)  Enalapril Maleate 5 Mg Tabs (Enalapril Maleate) .... One By Mouth Daily 5)  Vistaril 50 Mg Caps (Hydroxyzine Pamoate) .... One Pill Every 6 Hours As Needed 6)  Topamax 50 Mg Tabs (Topiramate) .... One Tab By Mouth At Night To Prevent Migraines 7)  Ibuprofen 800 Mg Tabs (Ibuprofen) .... One By Mouth Every 8 Hours As Needed For Migraine Pain; Take With Food 8)  Natural Fiber Laxative 28.3 % Powd (Psyllium) .... 30 G By Mouth Daily 9)  Zyprexa 10 Mg Tabs (Olanzapine) 10)  Flexeril 10 Mg Tabs (Cyclobenzaprine Hcl) .... Take One Tablet Up To Three Times A Day For Back Spasms. 11)  Sumatriptan Succinate 100 Mg Tabs (Sumatriptan Succinate) .... Take 1 Pill When You Feel Migraines Beginning.  If No Relief in 2 Hours, You Make Take Another Pill.  Do Not Take More Than 2 Pills Daily.  Allergies (verified): 1)  ! Morphine  Social History: Smoking Status:  quit  Physical Exam  General:  Vital signs reviewed. Well-developed, well-nourished patient in NAD.  Awake and cooperative  Head:  normocephalic and atraumatic.   Eyes:  vision grossly intact, pupils equal, pupils round, and pupils reactive to light.   Ears:  External ear exam shows no significant lesions or deformities.  Otoscopic  examination reveals clear canals, tympanic membranes are intact bilaterally without bulging, retraction, inflammation or discharge. Hearing is grossly normal bilaterally. Nose:  no external deformity.  Nasal turbinates non-inflamed Mouth:  oral mucosa moist and pink  Neck:  supple without masses.  Some tenderness and tightness noted along upper trapezius muscles including trigger points. Neurologic:  no focal deficits   Impression & Recommendations:  Problem # 1:  MIGRAINE HEADACHE (ICD-346.90) Assessment Deteriorated  Patient with  more frequent migraines occurring since July.  Has been on same 50  mg dose Topamax for past year or so.  100 mg daily is recommended dose to help with prevention.  Plan to increase dose today.  Also started oral Sumatriptan to abort symptoms once they start.  Gave red flags, as well as side effects to medication including possible dysphoria and increased extremity tingling, which could be possible to both increased Topamax dose as well as Sumatriptan.  If no improvement with oral Sumatriptan, may need to try intranasal or injectable form in future.  Of note, patient blood pressure controlled, no history of CAD.    Given 30 mg Toradol and 25 mg Phenegan IM here in clinic.  Follow-up 2 weeks with PCP to assess tolerance to medication changes.    Her updated medication list for this problem includes:    Ibuprofen 800 Mg Tabs (Ibuprofen) ..... One by mouth every 8 hours as needed for migraine pain; take with food    Sumatriptan Succinate 100 Mg Tabs (Sumatriptan succinate) .Marland Kitchen... Take 1 pill when you feel migraines beginning.  if no relief in 2 hours, you make take another pill.  do not take more than 2 pills daily.  Orders: [facility]- Est Level  3 ([address])  Complete Medication List: 1)  Qvar 80 Mcg/act Aers (Beclomethasone dipropionate) .... 2 puffs two times a day for asthma; disp qs for 1 month 2)  Albuterol 90 Mcg/act Aers (Albuterol) .... Inhale 2 puff using inhaler every six hours as needed 3)  Hydrochlorothiazide 25 Mg Tabs (Hydrochlorothiazide) .... Take 1 tablet by mouth once a day 4)  Enalapril Maleate 5 Mg Tabs (Enalapril maleate) .... One by mouth daily 5)  Vistaril 50 Mg Caps (Hydroxyzine pamoate) .... One pill every 6 hours as needed 6)  Topamax 50 Mg Tabs (Topiramate) .... One tab by mouth at night to prevent migraines 7)  Ibuprofen 800 Mg Tabs (Ibuprofen) .... One by mouth every 8 hours as needed for migraine pain; take with food 8)  Natural Fiber Laxative 28.3 % Powd (Psyllium) .... 30 g by mouth daily 9)  Zyprexa 10 Mg Tabs (Olanzapine) 10)  Flexeril 10  Mg Tabs (Cyclobenzaprine hcl) .... Take one tablet up to three times a day for back spasms. 11)  Sumatriptan Succinate 100 Mg Tabs (Sumatriptan succinate) .... Take 1 pill when you feel migraines beginning.  if no relief in 2 hours, you make take another pill.  do not take more than 2 pills daily.  Patient Instructions: 1)  There are 2 ways to treat migraines:  1) stopping them when the start, and 2) preventing them from starting. 2)  For today to prevent them from starting, we will increase your Topamax to 100 mg daily, which is the recommended dose.   3)  Once a migraine has started, you should take the Sumatriptan 1 pill.  If you are no better in 2 hours, you may take another pill.  DO NOT TAKE MORE THAN 2 pills a day. 4)  Please see  Dr. Madolyn Frieze in 2-3 weeks to see how you are doing with the medication changes.     Prescriptions: SUMATRIPTAN SUCCINATE 100 MG TABS (SUMATRIPTAN SUCCINATE) Take 1 pill when you feel migraines beginning.  If no relief in 2 hours, you make take another pill.  Do not take more than 2 pills daily.  #12 x 1   Entered and Authorized by:   Renold Don MD   Signed by:   Renold Don MD on 04/04/2010   Method used:   Electronically to        [facility] [phone]* (retail)       [address]       [place], [place]  [address]       Ph: [phone] or [phone]       Fax: [phone]   RxID:   [id number]   Appended Document: migraine/[place]     Allergies: 1)  ! Morphine   Complete Medication List: 1)  Qvar 80 Mcg/act Aers (Beclomethasone dipropionate) .... 2 puffs two times a day for asthma; disp qs for 1 month 2)  Albuterol 90 Mcg/act Aers (Albuterol) .... Inhale 2 puff using inhaler every six hours as needed 3)  Hydrochlorothiazide 25 Mg Tabs (Hydrochlorothiazide) .... Take 1 tablet by mouth once a day 4)  Enalapril Maleate 5 Mg Tabs (Enalapril maleate) .... One by mouth daily 5)  Vistaril 50 Mg Caps  (Hydroxyzine pamoate) .... One pill every 6 hours as needed 6)  Topamax 50 Mg Tabs (Topiramate) .... One tab by mouth at night to prevent migraines 7)  Ibuprofen 800 Mg Tabs (Ibuprofen) .... One by mouth every 8 hours as needed for migraine pain; take with food 8)  Natural Fiber Laxative 28.3 % Powd (Psyllium) .... 30 g by mouth daily 9)  Zyprexa 10 Mg Tabs (Olanzapine) 10)  Flexeril 10 Mg Tabs (Cyclobenzaprine hcl) .... Take one tablet up to three times a day for back spasms. 11)  Sumatriptan Succinate 100 Mg Tabs (Sumatriptan succinate) .... Take 1 pill when you feel migraines beginning.  if no relief in 2 hours, you make take another pill.  do not take more than 2 pills daily.  Other Orders: Ketorolac-Toradol 15mg  [phone]) Promethazine up to 50mg  (J2550)    Medication Administration  Injection # 1:    Medication: Ketorolac-Toradol 15mg     Diagnosis: MIGRAINE HEADACHE (ICD-346.90)    Route: IM    Site: LUOQ gluteus    Exp Date: 09/20/2011    Lot #: [id number]    Mfr: [email or web address]    Comments: gave 30mg     Patient tolerated injection without complications    Given by: Jimmy Footman, CMA (April 04, 2010 11:52 AM)  Injection # 2:    Medication: Promethazine up to 50mg     Diagnosis: MIGRAINE HEADACHE (ICD-346.90)    Route: IM    Site: RUOQ gluteus    Exp Date: 10/18/2011    Lot #: [id number]    Mfr: [email or web address]    Comments: gave 25mg     Patient tolerated injection without complications    Given by: Jimmy Footman, CMA (April 04, 2010 11:55 AM)  Orders Added: 1)  Ketorolac-Toradol 15mg  [J1885] 2)  Promethazine up to 50mg  [J2550]

## 2010-09-20 NOTE — Miscellaneous (Signed)
Summary: Correction in asthma Dx  Clinical Lists Changes  Problems: Removed problem of ASTHMA, UNSPECIFIED (ICD-493.90) Added new problem of ASTHMA, INTERMITTENT (ICD-493.90)

## 2010-09-20 NOTE — Assessment & Plan Note (Signed)
Summary: MIGRAINES/[facility]   Primary Care Provider:  Myrtie Soman  MD   History of Present Illness: 1. migraines - chronic description of typical migraine: forehead and neck, midline; throbbing/pulsating. aura: flashes, sparkles both eyes nausea: yes     photophobia: yes     phonophobia: yes vomiting: yes - 3 times in last 2 weeks age of first migraine: age 45 headache free days in one month? 4-5 worse with menses? no caffeine intake: none     alcohol: none     smoking: yes - 2-3 cigs/day soda, juice intake: some juice; off sodas for 2 months known triggers: none  on prophylaxis? yes - propranolol, seemed to help at first, but not anymore abortive therapy: excedrin     successful?: no previous medications for migraine: midrin - helped some  headache currently: yes...going on for 2 days, tried excedrin which hasn't helped  previous imaging: none recent  PMH: significant for bipolar disorder  ROS: chest pain:  no   dyspnea: no    focal weakness/numbness: no    neck stiffness: no        Current Medications (verified): 1)  Qvar 80 Mcg/act Aers (Beclomethasone Dipropionate) .... 2 Puffs Two Times A Day For Asthma; Disp Qs For 1 Month 2)  Albuterol 90 Mcg/act Aers (Albuterol) .... Inhale 2 Puff Using Inhaler Every Six Hours As Needed 3)  Hydrochlorothiazide 25 Mg Tabs (Hydrochlorothiazide) .... Take 1 Tablet By Mouth Once A Day 4)  Seroquel 200 Mg Tabs (Quetiapine Fumarate) .... 3 Tabs By Mouth At Night 5)  Enalapril Maleate 5 Mg Tabs (Enalapril Maleate) .... One By Mouth Daily 6)  Haldol 2 Mg .... One By Mouth Qam 7)  Vistaril 50 Mg Caps (Hydroxyzine Pamoate) .... One Pill Every 6 Hours As Needed 8)  Propranolol Hcl 40 Mg Tabs (Propranolol Hcl) .... One By Mouth Two Times A Day To Prevent Migraines  Allergies (verified): 1)  ! Morphine  Review of Systems       review of systems as noted in HPI section   Physical Exam  General:  vital signs reviewed; underweight Alert,  appropriate; well-dressed and well-nourished  Eyes:  PERRL, EOMI, fundoscopic exam unremarkable  Neck:  full range of motion Lungs:  work of breathing unlabored, clear to auscultation bilaterally; no wheezes, rales, or ronchi; good air movement throughout  Heart:  regular rate and rhythm, no murmurs; normal s1/s2  Extremities:  distal pulses full; no cyanosis, clubbing, or edema  Neurologic:  alert and oriented. speech normal. CN II-XII intact. Station and gait normal. Normal upper and lower extremity strength. CN II-XII intact. PatellarDTRs symmetric. Romberg negative. Normal finger-nose. Sensation intact.      Impression & Recommendations:  Problem # 1:  MIGRAINE HEADACHE (ICD-346.90) Assessment Deteriorated  abortive therapy in the office today: toradol, phenergan, decadron; discontinue propranolol and start topamax at low dose; start abortive therapy with flexeril and high dose ibuprofen for future migraines; stop smoking. follow-up in one month. Normal neuro exam today.  The following medications were removed from the medication list:    Propranolol Hcl 40 Mg Tabs (Propranolol hcl) ..... One by mouth two times a day to prevent migraines Her updated medication list for this problem includes:    Ibuprofen 800 Mg Tabs (Ibuprofen) ..... One by mouth every 8 hours as needed for migraine pain; take with food  Orders: [facility]- Est  Level 4 ([address])  Complete Medication List: 1)  Qvar 80 Mcg/act Aers (Beclomethasone dipropionate) .... 2 puffs two times  a day for asthma; disp qs for 1 month 2)  Albuterol 90 Mcg/act Aers (Albuterol) .... Inhale 2 puff using inhaler every six hours as needed 3)  Hydrochlorothiazide 25 Mg Tabs (Hydrochlorothiazide) .... Take 1 tablet by mouth once a day 4)  Seroquel 200 Mg Tabs (Quetiapine fumarate) .... 3 tabs by mouth at night 5)  Enalapril Maleate 5 Mg Tabs (Enalapril maleate) .... One by mouth daily 6)  Haldol 2 Mg  .... One by mouth qam 7)  Vistaril 50 Mg  Caps (Hydroxyzine pamoate) .... One pill every 6 hours as needed 8)  Topamax 25 Mg Tabs (Topiramate) .... One tab by mouth at night to prevent migraines 9)  Ibuprofen 800 Mg Tabs (Ibuprofen) .... One by mouth every 8 hours as needed for migraine pain; take with food 10)  Flexeril 5 Mg Tabs (Cyclobenzaprine hcl) .... One tab by mouth every 8 hours as needed for migraine pain  Patient Instructions: 1)  when you get another migraine, take the ibuprofen and flexeril to help with the pain 2)  stop the propranolol. 3)  start the topamax.  4)  follow-up in one month to see how this is doing. 5)  work on stopping smoking completely. think about our free classes Prescriptions: FLEXERIL 5 MG TABS (CYCLOBENZAPRINE HCL) one tab by mouth every 8 hours as needed for migraine pain  #60 x 1   Entered and Authorized by:   Myrtie Soman  MD   Signed by:   Myrtie Soman  MD on 10/27/2009   Method used:   Electronically to        [facility] [phone]* (retail)       [address]       [place], [place]  [address]       Ph: [phone] or [phone]       Fax: [phone]   RxID:   [id number] IBUPROFEN 800 MG TABS (IBUPROFEN) one by mouth every 8 hours as needed for migraine pain; take with food  #60 x 1   Entered and Authorized by:   Myrtie Soman  MD   Signed by:   Myrtie Soman  MD on 10/27/2009   Method used:   Electronically to        [facility] [phone]* (retail)       [address]       [place], [place]  [address]       Ph: [phone] or [phone]       Fax: [phone]   RxID:   [id number] TOPAMAX 25 MG TABS (TOPIRAMATE) one tab by mouth at night to prevent migraines  #30 x 1   Entered and Authorized by:   Myrtie Soman  MD   Signed by:   Myrtie Soman  MD on 10/27/2009   Method used:   Electronically to        [facility] [phone]* (retail)       [address]       [place], [place]  [address]       Ph: [phone] or [phone]       Fax: [phone]   RxID:   [id number]   Appended Document: MIGRAINES/[facility]     Medication Administration  Injection # 1:    Medication: Promethazine up to 50mg   Diagnosis: MIGRAINE HEADACHE (ICD-346.90)    Route: IM    Site: LUOQ gluteus    Exp Date: 05/20/2011    Lot #: [id number]    Mfr: [email or web address]    Comments: 25 mg IM per MD orders    Patient tolerated injection without complications    Given by: Starleen Blue RN (October 27, 2009 3:23 PM)  Injection # 2:    Medication: Dexamethasone Sodium Phosphate 1mg     Diagnosis: MIGRAINE HEADACHE (ICD-346.90)    Route: IM    Site: L deltoid    Exp Date: 09/20/2010    Lot #: [id number]    Mfr: [email or web address] mg IM per MD orders    Comments: 10 mg IM per MD orders    Patient tolerated injection without complications    Given by: Starleen Blue RN (October 27, 2009 3:25 PM)  Injection # 3:    Medication: Ketorolac-Toradol 15mg     Diagnosis: MIGRAINE HEADACHE (ICD-346.90)    Route: IM    Site: RUOQ gluteus    Exp Date: 07.01012    Lot #: [id number]    Mfr: [email or web address]    Comments: 30 mg IM per MD order    Patient tolerated injection without complications    Given by: Starleen Blue RN (October 27, 2009 3:53 PM)  Orders Added: 1)  Promethazine up to 50mg  [J2550] 2)  Dexamethasone Sodium Phosphate 1mg  [J1100] 3)  Ketorolac-Toradol 15mg  [J1885]

## 2010-09-20 NOTE — Miscellaneous (Signed)
Summary: Order for therapy  Patient dropped off form to be filled out for therapy.  Please fax when completed. Bradly Bienenstock  Jan 10, 2010 10:40 AM to pcp.Golden Circle RN  Jan 10, 2010 10:43 AM  form completed.  Appended Document: Order for therapy form faxed 01/09/10

## 2010-09-20 NOTE — Miscellaneous (Signed)
Summary: Prior Auth  Clinical Lists Changes Prior auth for Advair to PCP .Marland KitchenGladstone Pih  September 07, 2009 4:40 PM  does not qualify for prior auth. Please call pt that we need to perform trial of QVAR. If asthma symptoms not controlled after one month, will need to schedule an office visit. thanks.  Medications: Changed medication from ADVAIR DISKUS 250-50 MCG/DOSE MISC (FLUTICASONE-SALMETEROL) Inhale 1 puff as directed twice a day to QVAR 80 MCG/ACT AERS (BECLOMETHASONE DIPROPIONATE) 2 puffs two times a day for asthma; disp QS for 1 month - Signed Rx of QVAR 80 MCG/ACT AERS (BECLOMETHASONE DIPROPIONATE) 2 puffs two times a day for asthma; disp QS for 1 month;  #1 x 2;  Signed;  Entered by: Myrtie Soman  MD;  Authorized by: Myrtie Soman  MD;  Method used: Electronically to [facility] [phone]*, [address] [place], [place], [place]  [address], Ph: [phone] or [phone], Fax: [phone]    Prescriptions: QVAR 80 MCG/ACT AERS (BECLOMETHASONE DIPROPIONATE) 2 puffs two times a day for asthma; disp QS for 1 month  #1 x 2   Entered and Authorized by:   Myrtie Soman  MD   Signed by:   Myrtie Soman  MD on 09/08/2009   Method used:   Electronically to        [facility] [phone]* (retail)       [address]       [place], [place]  [address]       Ph: [phone] or [phone]       Fax: [phone]   RxID:   [id number]  Called left message to return call .Golden Circle RN  September 08, 2009 3:23 PM   pt returned call - pls call back at [phone] [facility]  September 08, 2009 4:06 PM
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
## 2010-12-03 LAB — HGB A2 (HEMOGLOBIN A214), QUANTITATIVE
Hgb A2 & Hgb F Interpretation: 0
Hgb F Quant: 0 % (ref 0.0–2.0)

## 2010-12-03 LAB — DIFFERENTIAL
Eosinophils Absolute: 0.3 10*3/uL (ref 0.0–0.7)
Lymphocytes Relative: 25 % (ref 12–46)
Lymphs Abs: 2 10*3/uL (ref 0.7–4.0)
Neutro Abs: 5.1 10*3/uL (ref 1.7–7.7)
Neutrophils Relative %: 65 % (ref 43–77)

## 2010-12-03 LAB — BASIC METABOLIC PANEL
BUN: 8 mg/dL (ref 6–23)
Calcium: 9.2 mg/dL (ref 8.4–10.5)
Creatinine, Ser: 0.92 mg/dL (ref 0.4–1.2)
GFR calc Af Amer: >60 mL/min (ref >60)
GFR calc non Af Amer: >60 mL/min (ref >60)

## 2010-12-03 LAB — POCT URINALYSIS DIP (DEVICE)
Bilirubin Urine: NEGATIVE
Glucose, UA: NEGATIVE mg/dL
Ketones, ur: NEGATIVE mg/dL
Specific Gravity, Urine: <=1.005 (ref 1.005–1.030)
Urobilinogen, UA: 0.2 mg/dL (ref 0.0–1.0)

## 2010-12-03 LAB — RAPID URINE DRUG SCREEN, HOSP PERFORMED
Amphetamines: NOT DETECTED
Barbiturates: NOT DETECTED
Benzodiazepines: NOT DETECTED

## 2010-12-03 LAB — LIPID PANEL
HDL: 54 mg/dL (ref >39)
VLDL: 25 mg/dL (ref 0–40)

## 2010-12-03 LAB — URINE CULTURE

## 2010-12-03 LAB — HEPATIC FUNCTION PANEL
Bilirubin, Direct: 0.1 mg/dL (ref 0.0–0.3)
Indirect Bilirubin: 0.4 mg/dL (ref 0.3–0.9)

## 2010-12-03 LAB — CBC
Platelets: 256 10*3/uL (ref 150–400)
RBC: 3.88 MIL/uL (ref 3.87–5.11)
WBC: 7.8 10*3/uL (ref 4.0–10.5)

## 2010-12-03 LAB — GC/CHLAMYDIA PROBE AMP, GENITAL
Chlamydia, DNA Probe: NEGATIVE
GC Probe Amp, Genital: NEGATIVE

## 2010-12-03 LAB — POCT PREGNANCY, URINE: Preg Test, Ur: NEGATIVE

## 2010-12-03 LAB — ETHANOL: Alcohol, Ethyl (B): <5 mg/dL (ref 0–10)

## 2011-01-01 NOTE — Assessment & Plan Note (Signed)
Connie Black returns to clinic today for followup evaluation.  She reports  that she went to the emergency room this past Sunday as she was having  increased pain in her back.  She reports that they gave her no medicines  but she was prescribed ice packs, which helped somewhat.  She reports  that she can still not take Percocet or morphine as she reports  allergies to these medicines.  She reports that she would like to try  something stronger than the Ultram as she does not feel she gets the  relief that she got previously.  The patient has been switched off  Zyprexa and Zoloft and is on Seroquel at the present time.  She also  reports that she has been off tobacco for approximately 1 month now.  She does continue to use the lidocaine patches and needs a refill on  that medication.   REVIEW OF SYSTEMS:  Noncontributory.   MEDICATIONS:  1. Seroquel daily.  2. Ultram 50 mg 1 tablet b.i.d. p.r.n.  3. Ventolin metered dose inhaler p.r.n.  4. Albuterol metered dose inhaler p.r.n.  5. Advair Diskus p.r.n.  6. Lidocaine patch 5%, 1-2 patches on 12 hours and off 12 hours daily.   PHYSICAL EXAMINATION:  GENERAL:  Well-appearing fit adult female in mild  to no acute discomfort.  VITAL SIGNS:  Blood pressure 117/82 with a pulse 76, respiratory rate  18, O2 saturation 99% on room air.  She has 5/5 strength throughout the bilateral upper and lower  extremities.  She ambulates without any assistive device.   IMPRESSION:  1. Chronic low back pain with mild degenerative disk disease at L5-S2.  2. History of bipolar disorder.  3. History of bulimia.   In the office today, we did refill the patient's Lidocaine patch.  We  also asked her to stop the Ultram and instead dry hydrocodone 5/325 1  tablet t.i.d. p.r.n.  As noted above, the patient cannot take Percocet  nor morphine.  We will plan on seeing the patient in followup in  approximately 2-3 months' time to see how she is doing on the new  prescription in place of her Ultram.           ______________________________  Ellwood Dense, M.D.     DC/MedQ  D:  12/10/2007 10:11:27  T:  12/10/2007 10:58:57  Job #:  [id number]

## 2011-01-01 NOTE — Assessment & Plan Note (Signed)
Connie Black returns to clinic today for followup evaluation.  During the  last clinic visit March 28, 2008, we had changed her from hydrocodone  to oxycodone used 3 times a day as needed.  She reports that she is  getting much better relief on the oxycodone compared to the hydrocodone.  She does use generally 2-3 per day, but just ran out of the prescription  yesterday, last filled June 01, 2008.  It is generally lasting her a  month and a half at a time at present.  She does have good days and bad  days, but overall certainly not overusing the medication.   REVIEW OF SYSTEMS:  Noncontributory.   MEDICATIONS:  1. Seroquel daily.  2. Ventolin metered-dose inhaler p.r.n.  3. Albuterol metered-dose inhaler p.r.n.  4. Lidocaine patch 5% 1-2 patches on 12 hours and off 12 hours daily.  5. Oxycodone 5 mg 1 tablet t.i.d. p.r.n. (2-3 per day).   PHYSICAL EXAMINATION:  GENERAL:  Well-appearing fit adult female in mild-  to-no acute discomfort.  Blood pressure and vitals were not obtained.  She has 5/5 strength throughout the bilateral upper and lower  extremities.  She ambulates without any assistive device.   IMPRESSION:  1. Chronic low back pain with mild degenerative disk disease at L5-S2.  2. History of bipolar disorder.  3. History of bulimia.   In the office today, we did refill the patient's oxycodone and her  lidocaine patches.  We will plan on seeing her in followup at  approximately 3-4 months' time.  She continues to get good analgesic  effect without signs of diversion or significant side effects.           ______________________________  Ellwood Dense, M.D.     DC/MedQ  D:  07/18/2008 09:50:55  T:  07/18/2008 22:27:32  Job #:  [id number]
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
## 2011-01-01 NOTE — Assessment & Plan Note (Signed)
Connie Black returns to clinic today for followup evaluation.  During the  last clinic visit on December 10, 2007, we had switched her from Ultram to  hydrocodone to try to give her better pain relief.  She reports that she  is not getting much relief from the hydrocodone at this point and is  concerned about the Tylenol in the medication.  She would like to switch  to something different that does not have Tylenol.  She does need a  refill on her lidocaine patches additionally in the office today.   REVIEW OF SYSTEMS:  Noncontributory.   MEDICATIONS:  1. Seroquel daily.  2. Ventolin metered-dose inhaler p.r.n.  3. Albuterol metered-dose inhaler p.r.n.  4. Advair Diskus p.r.n.  5. Lidocaine patch 5%, 1-2 patches on 12 hours and off 12 hours daily.  6. Hydrocodone 5/325 one tablet t.i.d. p.r.n.   PHYSICAL EXAMINATION:  A well-appearing fit adult female in mild to no  acute discomfort.  Blood pressure 129/71 with a pulse of 105,  respiratory rate 18, and O2 saturation 98% on room air.  She has 5/5  strength throughout the bilateral upper and lower extremities.  She  ambulates without any assistive device.   IMPRESSION:  1. Chronic low back pain with mild degenerative disk disease at L5-S2.  2. History of bipolar disorder.  3. History of bulimia.   In the office today, we did switch the patient from hydrocodone to  oxycodone 5 mg 1 tablet t.i.d. p.r.n.  She is to stop hydrocodone  completely at this point.  We also refilled her lidocaine in the office  today.  We will plan on seeing the patient in followup in approximately  3-4 months' time, with refills prior to that appointment as necessary.  She understands that she needs to call in for this medication, that we  will be unable to fill it by phone as we had been able to do with the  hydrocodone.  We will plan on seeing the patient in followup as noted  above.           ______________________________  Ellwood Dense, M.D.     DC/MedQ  D:  03/28/2008 11:09:44  T:  03/29/2008 02:03:18  Job #:  [id number]

## 2011-01-01 NOTE — Assessment & Plan Note (Signed)
[name] returns to clinic today for follow-up evaluation.  She  reports that overall she is doing well.  She continues to get a good bit  of relief from the Ultram and lidocaine patches.  She uses the patches  either one or two per day on her neck and low back.  She denies any  refill on each of these in the office today.  Her other medicines have  been unchanged.   REVIEW OF SYSTEMS:  Non-contributory.   MEDICATIONS:  1. Zyprexa 50 mg q. day.  2. Zoloft 15 mg q. day.  3. Ultram 50 mg 1 tablet b.i.d. p.r.n.  4. Ventolin metered dose inhaler p.r.n.  5. Albuterol metered-dose inhaler p.r.n.  6. Advair discus p.r.n.  7. Lidocaine patch 5% 1-2 patches on 12 hours and off 12 hours daily.   PHYSICAL EXAMINATION:  A well appearing fit adult female in mild to no  acute discomfort.  Blood pressure 139/97 with a pulse of 80, straight 20  and O2 saturation 100% on room air.  She ha 5/5 strength.  She ambulates  without any assistive device.  Lumbar and cervical range of motion was  decreased throughout.   IMPRESSION:  1. Chronic low back and mild degenerative disk disease at L5-S2.  2. History of bipolar disorder.  3. History of bulimia.  4. In the office today we did refill the patient's Ultram and      lidocaine patches each with 3 refills.  We will plan on seeing the      patient in follow-up in this office in approximately 4 months time      with refills prior to that appointment if necessary.           ______________________________  Ellwood Dense, M.D.     DC/MedQ  D:  07/13/2007 13:26:07  T:  07/13/2007 13:49:38  Job #:  [id number]

## 2011-01-01 NOTE — Assessment & Plan Note (Signed)
Connie Black returns to clinic today for followup evaluation.  She reports  that she is using her Ultram usually twice a day but gets relief for  only approximately 3 or 4 hours afterwards.  She wonders if she could  possibly increase the strength or the frequency.  She is using her  Lidoderm patches on a daily basis and needs a refill on both those  medicines.  Her other medicines have been unchanged.   REVIEW OF SYSTEMS:  Noncontributory.   MEDICATIONS:  1. Zyprexa 15 mg daily.  2. Zoloft 15 mg daily.  3. Ultram 50 mg 1 tablet b.i.d. p.r.n.  4. Ventolin metered dose inhaler p.r.n.  5. Albuterol metered dose inhaler p.r.n.  6. Advair Diskus p.r.n.  7. Lidocaine patch 5% 1 patch on 12 hours and off 12 hours daily.   PHYSICAL EXAMINATION:  GENERAL:  Well-appearing, thin adult female in no  acute discomfort.  VITAL SIGNS:  Blood pressure 111/68, pulse 85, respiratory rate 18 and  O2 saturation 99% on room air.  She has 5/5 strength throughout.   IMPRESSION:  1. Chronic low back pain with mild degenerative disk disease at L5-S2.  2. History of bipolar disorder.  3. History of bulimia.   In the office today, we did increase the patient's Ultram to 1 tablet  q.i.d. p.r.n. a total of 120 were prescribed.  Also refilled her  lidocaine.  We will plan on seeing her in followup in approximately 4  month's time with refills prior to that appointment if necessary.           ______________________________  Ellwood Dense, M.D.     DC/MedQ  D:  03/26/2007 10:55:17  T:  03/26/2007 13:50:39  Job #:  [id number]

## 2011-01-04 NOTE — Group Therapy Note (Signed)
REFERRING PHYSICIAN:  Sharolyn Douglas, M.D.   PURPOSE OF EVALUATION:  Evaluate and treat chronic low back pain.   HISTORY OF PRESENT ILLNESS:  Connie Black is a 45 year old adult female  referred to this office by Dr. Noel Gerold for treatment of chronic low back pain.   The patient has medical records available that were sent prior to this  appointment and reviewed prior to this visit.  We went over those with her  when she presented to the office today.   According to the patient she has a history of low back pain dating back to  2000 when she injured her back when she was trying to catch her daughter as  her daughter was falling towards a concrete floor.  The patient reports that  she had minimal pain initially, but then the next day or so woke up with  severe pain such that she had to go to the urgent care center.  X-rays at  urgent care reportedly showed partial sacralized small interspace between L5-  S1.  No obvious fracture, subluxation, or degenerative changes with no  spondylosis or spondylolisthesis noted.   Between 2000 and 2002 the patient reports that she got somewhat better but  still continued to take hydrocodone.   The patient reports that she fell some time in mid 2002 and worsened her  back pain.  Apparently an MRI scan at that time approximately April 26, 2001 showed no significant changes that warranted surgery.  I do not have  the exact reading of that MRI scan.   Between 2002 and 2005, the patient reports that she went to a chiropractor  on a daily basis. She was evaluated by two neurosurgeons, Dr. Venetia Maxon and Dr.  Franky Macho, and told by each that she was not a candidate for surgery  especially given her age.  She was treated with Celebrex along with epidural  steroid injections.  She reports that the first injection helped, but the  second and third gave her no relief.  She had a total of three at that time.   The patient reports that in June 2005 she awoke with severe  pain in her back  for unknown reasons.  An MRI scan was done February 10, 2004 and showed mild  central bulge at L5-S1 level with slight nerve root impingement on the right  side with no significant change compared to the study in September 2002.   On July 25, 2004, the patient saw her new primary care physician, Dr.  Magnus Ivan, after she was referred from urgent care.  Dr. Magnus Ivan planned  epidural steroid injection and she had two which gave her no relief.  Started treatment with Darvocet at that time.   On August 24, 2004, the patient saw Dr. Magnus Ivan in followup.  At that time  she was referred to Dr. Noel Gerold, her local orthopedist.   On October 05, 2004, Dr. Noel Gerold saw her for the single time.  The diagnosis  was L5-S1 degenerative disk disease with severe low back pain into her  bilateral lower extremities.  He recommended no surgical intervention and  recommended pain management.  He did start her on Ultram 50 mg and she has  been using that one to two tablets p.o. b.i.d. to t.i.d.   Presently, the patient reports sharp pain across her lower back region with  no significant radiation into her lower extremities.  It does radiate  somewhat into her hips.  She reports that she got hydrocodone through  urgent  care in the fall of 2005, but that has not been working and she is not using  that medication.  She also reports that Darvocet helped for only a short  period of time, but she stopped using that medication due to  ineffectiveness.  She reports that the Ultram is giving her some relief and  she generally uses 50 mg two tablets two to three times per day.   REVIEW OF SYSTEMS:  Noncontributory.   ALLERGIES:  MORPHINE.   FAMILY HISTORY:  Positive for breast cancer and hypertension.   MEDICATIONS:  1.  Zyprexa 15 mg q.h.s.  2.  Zoloft 50 mg daily.  3.  Ultram 50 mg one tablet q.6h. p.r.n.  4.  Ventolin metered-dose inhaler p.r.n.  5.  Albuterol metered-dose inhaler p.r.n.  6.   Advair Diskus metered-dose inhaler p.r.n.   PAST MEDICAL HISTORY:  1.  History of benign cysts, resected from the anterior neck.  2.  History of bipolar disorder treated with Zyprexa and Zoloft at [facility], [place].  3.  Asthma.  4.  History of bulimia without symptoms for 2+ years.   SOCIAL HISTORY:  The patient is single, but has four grown children.  She  has one grandchild who accompanies her into the office today.  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  She smokes three cigarettes  per day for the past 15 years and reports occasional alcohol intake.   PHYSICAL EXAMINATION:  GENERAL:  Well-appearing, very thin adult female in  mild to no acute discomfort. She is alert and oriented in the office today  and cooperative throughout the exam.  VITAL SIGNS:  Blood pressure 125/68 with a pulse of 98, respiratory rate 16,  and O2 saturation 97% on room air.  NEUROLOGIC:  She is able to toe walk, heel walk, and walk normally without  problems.  EXTREMITY:  Upper extremity range of motion was full and pain-free.  Lumbar  range of motion showed only slight limitation in flexion.  Lateral bending  and rotation were not a significant problem for her.  Upper extremity exam  showed 5-/5 strength throughout.  Bulk was decreased and tone was normal.  Sensation was intact to light touch throughout the bilateral upper  extremities.  Lower extremity exam showed hip flexion and knee extension at  approximately 4/5 bilaterally.  Bulk was decreased and tone was normal.  Reflexes were 2+ at the bilateral knees and ankles.  In the supine position,  hip range of motion was normal, but she did complain of low back pain with  full hip flexion.  Straight leg raising was negative bilaterally to 50  degrees.  On palpation, there was mild tenderness in the lower lumbar region.  She was very bony throughout her pelvis with minimal subcutaneous  fat identified.   IMPRESSION:  1.   Low back pain related to mild degenerative disk disease at L5-S1.  2.  History of bipolar disorder.  3.  History of bulimia.   At the present time, the patient seems to get reasonable relief with the  Ultram.  We have refilled that at 50 mg one to two tablets p.o. t.i.d.  p.r.n.  We have given her a total of 120 with one refill.  We have also  given her approximately three samples of Lidoderm patch 5%.  Also given her  a prescription to be used once the samples went out.  She is apply to one  patch to her lumbar region on a daily basis, on 12 hours, and off 12 hours.  We have also obtained a urine drug screen for the patient in the office as  is our routine for new patients.  She has been warned against issues of  medications and warned against any allowing any individuals to use her  medicines.  She understands that all pain  medicines need to come to this office now and in the future as long as she  is a patient through this office.  We will plan on seeing the patient in  followup in this office in approximately one month's time.      DC/MedQ  D:  11/16/2004 10:57:57  T:  11/16/2004 11:49:05  Job #:  [id number]

## 2011-01-04 NOTE — Assessment & Plan Note (Signed)
HISTORY OF PRESENT ILLNESS:  Connie Black returns to the clinic today for  followup evaluation. I first and last saw her in this office on November 16, 2004 on referral from Dr. Noel Gerold for treatment of chronic low back pain.  During the last clinic visit, we have continued the patient on her Lidoderm  patches and allowed her to use up to 3 at a time, on 12 hours and off 12  hours. We had given her prescription for Ultram 50 mg to be used 1 to 2  tablets p.o. t.i.d. p.r.n., a maximum of 6 per day. She unfortunately has  been using them up to 4 times a day. She does report a fair amount of relief  with the combination of the Lidoderm patches and the Ultram at the present  time. During the last clinic visit, we did a urine drug screen, which came  back positive for marijuana and cocaine. The patient denies cocaine use but  reports that she has used marijuana up to approximately 1/2 weeks ago. We  have obtained a urine drug screen in the office today and she requests that  we at least give her some time to clear her system, as she is planning to  abstain from the use of marijuana. I have told her that I will continue  checking her but she does need to stop the use of marijuana and cocaine if  there has been any use of that in the past.   MEDICATIONS:  Zyprexa 15 mg q.h.s., Zoloft 50 mg q.d., Ultram 50 mg 1 to 2  tablets p.o. q.i.d. p.r.n., Ventolin metered-dose inhaler p.r.n., albuterol  metered-dose inhaler p.r.n., Advair Diskus p.r.n., Lidoderm patches 5% 1-3  patches, on 12 hours and off 12 hours.   PHYSICAL EXAMINATION:  GENERAL:  A well-appearing, thin, adult female in  mild acute discomfort.  VITAL SIGNS:  Blood pressure 157/76 with a pulse of 72, respiratory rate 16  and O2 saturation 100 % on room air.  NEUROLOGIC:  She is 5 minus over 5 strength throughout the bilateral upper  and lower extremities. Bulk and tone were normal. Reflexes were 2+ and  symmetrical. The lumbar range of motion   was slightly decreased in all  planes.   IMPRESSION:  1.  Low back pain related to mild degenerative disk disease L5-S1.  2.  History of bipolar disorder.  3.  History of boulimia.   RECOMMENDATIONS:  In the office today, we did request a repeat urine drug  screen. I have told her that she does need to stop using the marijuana and  cocaine, if she has used any of the cocaine. She denies it but it did show  up positive on her first urine drug screen. We have refilled her Ultram in  the office today and I have told her that she needs to take no more than 6  per day. We will plan on seeing the patient in followup at this office in  approximately 1 month.      DC/MedQ  D:  12/14/2004 11:00:28  T:  12/15/2004 12:24:59  Job #:  [id number]

## 2011-01-04 NOTE — Assessment & Plan Note (Signed)
FOLLOWUP OFFICE NOTE:  Ms. Godette returns to clinic today for followup  evaluation.  She reports that she is doing well overall using her Ultram and  Lidoderm patches.  She reports that she can move around better and is able  to care for her grandbaby that she cares for on a periodic basis.  She does  need a refill on both the Ultram and Lidoderm in the office today.   MEDICATIONS:  1. Zyprexa 15 mg q.h.s.  2. Zoloft 50 mg q.h.s.  3. Ultram 50 mg one tablet b.i.d. p.r.n.  4. Ventolin metered dose inhaler p.r.n.  5. Albuterol metered dose inhaler p.r.n.  6. Advair Diskus p.r.n.  7. Lidoderm patches 5%, 1-2 patches on 12 hours and off 12 hours daily.   REVIEW OF SYSTEMS:  Noncontributory.   PHYSICAL EXAMINATION:  GENERAL:  A well-appearing, thin adult female in mild  to no acute discomfort.  VITAL SIGNS:  Blood pressure of 79/63 with a pulse of 84, respiratory rate  16 and O2 saturation of 96% on room air.  NEUROLOGIC:  She has 5-/5 strength  in the bilateral upper and lower extremities.  BACK:  Lumbar range of motion was decreased in flexion and extension.   IMPRESSION:  1. Chronic low back pain with mild degenerative disk disease at L5-S1.  2. History of bipolar disorder.  3. History of bulimia.   In the office today, we did refill the patient's Ultram and Lidoderm patches  as noted above.  Will plan on seeing her in followup in approximately 6  months' time and refill prescriptions prior to that as necessary.  She  continues to be very compliant with medications and does not over-use the  medications.  There is no sign of diversion.  Will plan on seeing the  patient in followup as noted above.           ______________________________  Ellwood Dense, M.D.     DC/MedQ  D:  06/09/2006 13:05:12  T:  06/10/2006 11:25:42  Job #:  [id number]

## 2011-01-04 NOTE — H&P (Signed)
[place]  Patient:    Connie Black, Connie Black                    MRN: [id number] Adm. Date:  [id number] Disc. Date: [id number] Attending:  Cathren Laine CC:         Emergency Dept, [facility]                         History and Physical  HISTORY OF PRESENT ILLNESS:  This is a 45 year old female that has a history of low back pain for which she has been seen in the emergency room four or five times in the last number of months.  It started going into her right leg today, she had difficulty walking.  She has had some numbness and tingling in that leg and CT scan was done which was suspicious of a ruptured disc at L5-S1.  I came in to see her.  Her straight leg raise on the left was negative with a negative Spurlings maneuver.  Right straight leg raise was positive with a positive Spurlings maneuver at about 40 degrees.  There is no sensory alteration in the lower extremities.  Her knee jerks and ankle jerks were +1 and symmetrical.  There was no weakness I could detect in dorsi or plantar flexion.  She positive Naffzigers sign for back pain but not leg pain.  I ordered an MRI and it showed it a subligamentous rupture at L5-S1.  There was no significant compression of the nerve roots or thecal sac.  This is the sort of thing that will heal with time.  I ordered a gram of Solu-Medrol to be given IV, 30 mg of Toradol and I will discharge her home with Medrol Dosepak and Vioxx 50 mg p.o. q.d. for two weeks and I will give her 30 Vicodin one p.o. q.4h. p.r.n. for pain.  I told her to avoid bending, lifting, twisting, or sitting.  She is to come and see me in my office in six days time. DD:  02/07/00 TD:  02/07/00 Job: [id number]

## 2011-01-04 NOTE — Discharge Summary (Signed)
[place]  Patient:    Connie Black, Connie Black                      MRN: [id number] Adm. Date:  [id number] Disc. Date: [id number] Attending:  Barbee Cough CC:         [facility]   Discharge Summary  ADMISSION DIAGNOSIS: Anterior neck mass.  DISCHARGE DIAGNOSIS:  Thyroglossal duct cyst.  PROCEDURE:  Excision of thyroglossal duct cyst with cystrunk procedure, Jan 08, 2001.  CONDITION ON DISCHARGE:  The patient is discharged to home in stable condition in the company of her family.  DISCHARGE MEDICATIONS: 1. Vioxx 50 mg p.o. q.d. 2. Augmentin 500 mg p.o. b.i.d. for 10 days. 3. Percocet 5/325 one to two p.o. q.4-6h. p.r.n. pain.  Also discharged on admission medications which include: 1. Celebrex 200 mg p.o. q.d. 2. Flexoril 10 mg p.o. t.i.d. 3. Ramara 10 mg p.o. q.h.s. 4. Zyrtec 10 mg p.o. q.d.  ACTIVITY:  No lifting or straining.  No vigorous activity.  DIET:  As tolerated.  WOUND CARE:  Half strength hydrogen peroxide and bacitracin ointment applied to the incision twice daily.  FOLLOW-UP:  The patient will follow up in my office in 10 days for postoperative care and suture removal.  BRIEF HISTORY:  Ms. Hildebrandt is a 45 year old black female who was referred by [facility] for evaluation of a gradually enlarging mass in the anterior neck.  The patient reported a five-year history of asymptomatic gradual enlargement of a mass, measuring approximately 2 x 3 cm on evaluation.  She was treated with a course of antibiotics without change. CT scan of the neck was obtained and this showed a cystic appearing mass involving the midaspect of the neck immediately adjacent to the hyoid bone. 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  Given the patients history and findings, I scheduled her for excisional biopsy of the mass.  The risks,  benefits, and possible complications of this surgery were discussed in detail with the patient and her family prior to admission.  She understood and concurred with our plan for surgery.  HOSPITAL COURSE:  The patient is admitted to the ENT service on Jan 08, 2001. She was taken to the operating room at [facility] and under general anesthesia underwent excision of a thyroglossal duct cyst with cystrunk procedure to remove the midsection of the hyoid bone.  The patient tolerated the procedure without complications or difficulty and was transferred from the operating room to the recovery room in stable condition. From recovery she was transferred to unit 5700 for postoperative care.  The patients postoperative course was uneventful.  She was tolerating liquid and soft oral diet without difficulty.  On the first postoperative morning the incision was dry and intact.  Jackson-Pratt drain output had reduced to less than 20 cc per shift and the drain was removed without difficulty.  The incision was intact.  There was no evidence of infection or erythema.  The patient had normal bowel and bladder function, was ambulating without difficulty, and was discharged to home in stable condition with the above discharge instructions.  The patient is comfortable with the discharge planning and will follow up in my office in 10 days for postoperative care. DD:  01/09/01 TD:  01/09/01 Job: [id number]

## 2011-01-04 NOTE — Assessment & Plan Note (Signed)
[name] returns to clinic today for follow-up evaluation.  Overall, she  reports that she is doing well, using a combination of her Ultram and her  Lidoderm patches.  She generally uses one patch to her lumbar area and one  patch in her thoracic area but the thoracic area is not as painful as the  lumbar region.  I told her that she could put the patches on laterally on  her low back so that they can give her maximum benefit.  She does need a  refill on both the Ultram and the Lidoderm in the office today.   MEDICATIONS:  1.  Zyprexa 15 mg q.h.s.  2.  Zoloft 50 mg q.h.s.  3.  Ultram 50 mg one tablet t.i.d. p.r.n.  4.  Ventolin metered dose inhaler p.r.n.  5.  Albuterol metered dose inhaler p.r.n.  6.  Advair Diskus p.r.n.  7.  Lidoderm patches 5% one to two patches daily on 12 hours and off 12      hours.   REVIEW OF SYSTEMS:  Noncontributory.   PHYSICAL EXAMINATION:  GENERAL:  Well-appearing, thin adult female in mild  to no acute discomfort.  VITAL SIGNS:  Blood pressure 132/77 with a pulse 76, respiratory rate 16,  and O2 saturation 100% on room air.  NEUROLOGIC:  She has 5-/5 strength throughout the bilateral upper and lower  extremities.  Bulk and tone were normal.  Reflexes were 2+ and symmetrical.  She ambulates without any assistive device.   IMPRESSION:  1.  Chronic low back pain with mild degenerative disk disease at L5-S1.  2.  History of bipolar disorder.  3.  History of bulimia.   In the office today we did refill the patient's Ultram along with her  Lidoderm patches.  She will be applying the patches horizontally instead of  vertically on her lumbar region.  That should give her better relief from  her low back pain.  We will plan on seeing her on follow-up in approximately  four months' time.           ______________________________  Ellwood Dense, M.D.     DC/MedQ  D:  01/27/2006 09:35:28  T:  01/27/2006 12:06:26  Job #:  [id number]

## 2011-01-04 NOTE — Assessment & Plan Note (Signed)
MEDICAL RECORD NUMBER:  [id number]   [name] returns to the clinic today for follow-up evaluation. I first saw  the patient in this office November 16, 2004, for evaluation and treatment of  chronic low back pain. At that time we were using Ultram and Lidoderm  patches. She unfortunately did have a positive urine screen on that first  evaluation that showed positive marijuana and cocaine. We have not been able  to obtain a follow-up urine drug screen on her until she is seen in the  office today. I did see her March 14, 2005. At that time we refilled her  Lidoderm patches and Ultram. She reports that she is getting good relief  with those two medicines. 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  We will obtain the urine drug screen in the office today.   MEDICATIONS:  1.  Zyprexa 15 mg q.h.s.  2.  Zoloft 50 mg daily.  3.  Ultram 50 mg one tablet t.i.d. p.r.n.  4.  Ventolin metered dose inhaler p.r.n.  5.  Albuterol metered dose inhaler p.r.n.  6.  Advair Diskus p.r.n.  7.  Lidoderm patches 5% one to two patches on 12 hours and off 12 hours      daily.   PHYSICAL EXAMINATION:  Well-appearing, thin adult female in no acute  discomfort. Blood pressure 138/96 with a pulse of 83, respiratory rate 16,  and O2 saturation 99% on room air. Reflexes were 2+ and symmetrical.  Sensation was intact to light touch throughout. Strength was 5-/5 throughout  the bilateral upper and lower extremities.   IMPRESSION:  1.  Chronic low back pain with mild degenerative disc disease at L5-S1.  2.  History of bipolar disorder.  3.  History of bulimia.   In the office today, we did obtain the urine drug screen and also refilled  her Ultram and Lidoderm medications. She is doing reasonably well on those  and hopefully is clean from the prior positive for marijuana and cocaine.  Will plan on seeing her in follow-up in  approximately 3 months' time.           ______________________________  Ellwood Dense, M.D.     DC/MedQ  D:  06/10/2005 11:35:48  T:  06/10/2005 14:16:18  Job #:  [id number]
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
## 2011-01-04 NOTE — Assessment & Plan Note (Signed)
Ms. Eskridge returns to the clinic today for follow-up evaluation.  She  reports that overall, she is doing well.  She gets good relief from her  Ultram, usually used 0-2 tablets per day.  She also uses her Lidoderm  patches almost daily.  She does need refills on both of those in the  office today.   REVIEW OF SYSTEMS:  Noncontributory.   MEDICATIONS:  1. Zyprexa 15 mg nightly.  2. Zoloft 50 mg nightly.  3. Ultram 50 mg one tablet b.i.d. p.r.n.  4. Ventolin metered dose inhaler p.r.n.  5. Albuterol metered dose inhaler p.r.n.  6. Advair Diskus p.r.n.  7. Lidoderm patches 5% one to two patches on 12 hours and off 12 hours      daily p.r.n.   PHYSICAL EXAMINATION:  GENERAL APPEARANCE:  A well-appearing, thin adult  female in mild to no acute distress.  VITAL SIGNS:  Blood pressure 123/78, pulse 62, respiratory rate 16, O2  saturation 100% on room air.  NEUROLOGIC:  She has 5-/5 strength throughout the bilateral upper and  lower extremities.   IMPRESSION:  1. Chronic low back pain with mild degenerative disc disease at L5-S1.  2. History of bipolar disorder.  3. History of bulimia.   In the office today, we did refill the patient's Lidoderm patches along  with her Ultram.  Will plan on seeing her in follow-up in approximately  4-6 months' time.  She shows no signs of diversion and gets good  analgesic effect overall.           ______________________________  Ellwood Dense, M.D.     DC/MedQ  D:  12/03/2006 12:43:11  T:  12/03/2006 13:43:17  Job #:  [id number]

## 2011-01-04 NOTE — Discharge Summary (Signed)
[facility]  Patient:    Connie Black, Connie Black Visit Number: [id number] MRN: [id number]          Service Type: PSY Location: 300 0303 01 Attending Physician:  Jeanice Lim Dictated by:   Jeanice Lim, M.D. Admit Date:  11/05/2001 Discharge Date: 11/11/2001                             Discharge Summary  IDENTIFYING DATA:  This is a 45 year old African-American female voluntarily admitted for depression and suicidal ideation.  MEDICATIONS:  Albuterol, Remeron and Zyprexa.  ALLERGIES:  MORPHINE.  PHYSICAL EXAMINATION:  Essentially within normal limits.  Neurologically nonfocal.  LABORATORY DATA:  Glucose elevated at 138.  Urine drug screen positive for cocaine and marijuana.  Potassium low at 3.2.  Otherwise essentially within normal limits.  MENTAL STATUS EXAMINATION:  Thin, African-American female in bed, somewhat sleep but cooperative.  Speech slurred at times.  Mood somewhat dysphoric. Affect slightly irritable.  Thought processes goal directed.  Negative for clear psychotic symptoms or dangerous ideation other than passive suicidality. Cognitively intact.  Judgment and insight limited.  ADMISSION DIAGNOSES: Axis I:    Depressive disorder not otherwise specified. Axis II:   None. Axis III:  Asthma. Axis IV:   Moderate (problems with primary support). Axis V:    30/55.  HOSPITAL COURSE:  The patient was admitted and ordered routine p.r.n. medications and placed on a Librium detox protocol with close monitoring for safety.  Also restarted on Remeron, albuterol and Zyprexa.  Potassium was repleted.  The patient was also placed on a fall risk and Remeron and Zyprexa were optimized.  The patient tolerated medication increases.  Loxitane was added for acute psychotic symptoms as well as Elavil was restarted.  The patient did well with the medication changes and was significantly improved with no side effects.  CONDITION ON DISCHARGE:   Markedly improved.  Mood more euthymic.  Affect brighter.  Thought processes goal directed.  Thought content negative for dangerous ideation or psychotic symptoms.  DISCHARGE MEDICATIONS: 1. Remeron 30 mg, 2 q.h.s. 2. Zyprexa 10 mg, 3 q.h.s. 3. Loxitane 10 mg q.h.s. 4. Elavil 75 mg q.h.s.  FOLLOW-UP:  Dr. Evelene Croon on November 20, 2001 at 3:45 p.m.  DISCHARGE DIAGNOSES: Axis I:    Depressive disorder not otherwise specified. Axis II:   None. Axis III:  Asthma. Axis IV:   Moderate (problems with primary support). Axis V:    Global Assessment of Functioning on discharge 55. Dictated by:   Jeanice Lim, M.D. Attending Physician:  Jeanice Lim DD:  12/17/01 TD:  12/19/01 Job: [id number]

## 2011-01-04 NOTE — Op Note (Signed)
[place]  Patient:    Connie Black, Connie Black                      MRN: [id number] Proc. Date: 01/08/01 Adm. Date:  [id number] Attending:  Barbee Cough                           Operative Report  PREOPERATIVE DIAGNOSIS:  Anterior neck mass.  POSTOPERATIVE DIAGNOSIS:  Thyroglossal duct cyst.  OPERATION PERFORMED:  Excision of thyroglossal duct cyst with Sistrunk procedure.  SURGEON:  Kinnie Scales. Annalee Genta, M.D.  ANESTHESIA:  General endotracheal.  ASSISTANT:  Dr. Carlena Sax.  COMPLICATIONS:  None.  ESTIMATED BLOOD LOSS:  Minimal.  Patient transferred from the operating room to the recovery room in stable condition.  INDICATIONS FOR PROCEDURE:  The patient is a 45 year old black female who is referred for evaluation of a gradually enlarging anterior neck mass.  She initially noted the mass approximately five years ago and there has been some minor fluctuation with gradual enlargement over the last five years.  No associated symptoms, no significant discomfort.  No fever, redness, erythema or evidence of infection.  Evaluation including CT scan of the neck revealed a cystic mass involving the anterior aspect of the neck with extension into the left deep neck structures.  Working diagnosis included branchial cleft cyst, thyroglossal duct cyst, lymph node or possible tumor.  Given the patients history and examination, I have recommended that we undertake excisional biopsy of the mass under general anesthesia.  The risks, benefits and possible complications of these procedures were discussed in detail with the patient and her family who understood and concurred with our plan for surgery.  DESCRIPTION OF PROCEDURE:  The patient was brought to the operating room on Jan 08, 2001 and placed in supine position on the operating table.  General endotracheal anesthesia was established without difficulty.  When the patient had been adequately  anesthetized, she was prepped and draped in sterile fashion and was injected with 2 cc of 1% lidocaine 1:100,000 dilution epinephrine injected in subcutaneous fashion in the anterior neck skin overlying the mass.  After allowing adequate time for vasoconstriction, surgical procedure was begun by creating a 4 cm horizontally oriented incision in the pre-existing skin crease in the anterior neck.  This was carried through the skin and underlying subcutaneous tissue identifying the platysma muscle, dividing the platysma and elevating the subplatysmal planes.  The mass was apparent along the anterior aspect of the neck superficial to the thyroid cartilage and adherent to the anterior strap muscles.  The mass was gently dissected from surrounding soft tissue and was found to be densely attached to the midaspect of the hyoid bone.  Based on this finding, tentative intraoperative diagnosis of thyroglossal duct cyst was made and a Sistrunk procedure was then performed. The suprahyoid musculature including tongue musculature was dissected from the midaspect of the hyoid bone and using bone cutters, the hyoid bone was transected bilaterally just immediately medial to the medial cornu with a hyoid bone transected.  Dissection was then carried superiorly to the tongue base dissecting through muscle fibers and removing a small fibrous tract.  The wound was then thoroughly inspected and irrigated. There was no evidence of mucosal tear or pharyngeal injury.  Valsalva maneuver was performed and there was no evidence of leakage or bleeding.  The patients wound was then thoroughly irrigated with saline followed by antibiotic solution.  Prior to irrigation, a culture was taken of the cyst contents and sent to microbiology.  The resected specimen  was sent to pathology for gross microscopic evaluation.  With the wound clear, a 7 mm Blake drain was placed at the base of the wound through a separate stab incision  in the anterior aspect of the neck.  The strap muscles were reapproximated in the midline using a 4-0 Vicryl suture and platysma was reapproximated with the same stitch.  Final skin closure achieved with a 5-0 Ethilon suture in a running locked fashion and the wound was dressed with bacitracin ointment.  The patient was then awakened from her anesthetic, extubated and transferred from the operating room to the recovery room in stable condition with no complications.  Estimated blood loss for this procedure was minimal. DD:  01/08/01 TD:  01/08/01 Job: [id number]

## 2011-01-04 NOTE — Assessment & Plan Note (Signed)
SUBJECTIVE:  Connie Black returns to the clinic today for follow up  evaluation.  I have not seen her since December 14, 2004.  She had to go to [place] to care for her mother.  She is reporting good relief with the Ultram  and Lidoderm patches when she can get those.  Apparently, she called in and  reports that she was told she had to come in for an office visit before a  refill could be given.  I have discussed that with the office today and  tried to correct that problem.  She uses the Lidoderm patches either every  day or every other day.  She used the Ultram 50 mg and has been allowed to  use it 1-2 tablets p.o. q.i.d. p.r.n., but reports that she uses only  approximately three per day.  She is reporting good pain relief when she is  able to get those medications.   MEDICATIONS:  1.  Zyprexa 15 mg q.h.s.  2.  Zoloft 50 mg daily.  3.  Ultram 50 mg 1-2 tablets p.o. t.i.d. p.r.n.  4.  Ventolin metered-dose inhaler p.r.n.  5.  Albuterol metered-dose inhaler p.r.n.  6.  Advair discus p.r.n.  7.  Lidoderm patch 5% 1-3 patches on 12 hours and off 12 hours daily.   PHYSICAL EXAMINATION:  GENERAL APPEARANCE:  Well-appearing, thin, adult  female in mild acute discomfort.  VITAL SIGNS:  Blood pressure 158/103, pulse 79, respirations 16, O2  saturation 100% on room air.  She has 5-/5 strength throughout the bilateral  upper and lower extremities.  Bulk and tone were normal.  Reflexes were 2+  and symmetrical.  Lumbar range of motion was slightly decreased in all  planes.   IMPRESSION:  1.  Chronic low back pain with mild degenerative disk disease at L5-S1.  2.  History of bipolar disorder.  3.  History of bulimia.   PLAN:  At the present time, we have refilled the patient's Ultram and  Lidoderm patches.  We will plan on seeing her in follow up in approximately  three months time with refills prior to that appointment as necessary.       DC/MedQ  D:  03/14/2005 10:43:52  T:   03/14/2005 11:06:48  Job #:  [id number]

## 2011-01-04 NOTE — Assessment & Plan Note (Signed)
HISTORY OF PRESENT ILLNESS:  Connie Black returns to the clinic today for  follow up evaluation.  She reports that she is doing well on her Lidoderm  patches and altering medications on an as needed basis.  She needs a refill  on both of those in the office today.   MEDICATIONS:  1.  Zyprexa 15 mg q.h.s.  2.  Zoloft 50 mg q.h.s.  3.  Ultram 50 mg one tablet t.i.d. p.r.n.  4.  Ventolin metered-dose inhaler.  5.  Albuterol metered-dose inhaler.  6.  Advair Discus p.r.n.  7.  Lidoderm patches 5% 1-2 patches on 12 hours and off 12 hours daily.   REVIEW OF SYSTEMS:  Noncontributory.   PHYSICAL EXAMINATION:  GENERAL APPEARANCE:  Reasonably well-appearing, thin,  adult female in no acute discomfort.  VITAL SIGNS:  Blood pressure 135/93, pulse 85, respirations 16, O2  saturation 99% on room air.  She has 5-/5 strength throughout the bilateral  upper and lower extremities.  Bulk was decreased and tone was normal.   IMPRESSION:  1.  Chronic low back pain with mild degenerative disk disease at L5-S1.  2.  History of bipolar disorder.  3.  History of boulimia.   PLAN:  In the office today, we did refill the patient's Lidoderm and altered  medications as noted above.  We will plan on seeing her in follow up in  approximately 3-4 months time with refills prior to that appointment as  necessary.           ______________________________  Ellwood Dense, M.D.     DC/MedQ  D:  10/07/2005 11:06:54  T:  10/07/2005 12:17:11  Job #:  [id number]

## 2011-01-15 ENCOUNTER — Encounter: Payer: Medicaid Other | Admitting: Family Medicine

## 2011-01-21 ENCOUNTER — Other Ambulatory Visit (HOSPITAL_COMMUNITY)
Admission: RE | Admit: 2011-01-21 | Discharge: 2011-01-21 | Disposition: A | Discharge status: Home / Self Care | Payer: Medicaid Other | Source: Ambulatory Visit | Attending: Family Medicine | Admitting: Family Medicine

## 2011-01-22 ENCOUNTER — Ambulatory Visit (INDEPENDENT_AMBULATORY_CARE_PROVIDER_SITE_OTHER): Payer: Medicaid Other | Admitting: Family Medicine

## 2011-01-22 DIAGNOSIS — J45909 Unspecified asthma, uncomplicated: Secondary | ICD-10-CM

## 2011-01-22 DIAGNOSIS — M25519 Pain in unspecified shoulder: Secondary | ICD-10-CM

## 2011-01-22 DIAGNOSIS — Z01419 Encounter for gynecological examination (general) (routine) without abnormal findings: Secondary | ICD-10-CM | POA: Insufficient documentation

## 2011-01-22 DIAGNOSIS — G43909 Migraine, unspecified, not intractable, without status migrainosus: Secondary | ICD-10-CM

## 2011-01-22 DIAGNOSIS — Z0000 Encounter for general adult medical examination without abnormal findings: Secondary | ICD-10-CM

## 2011-01-22 DIAGNOSIS — M25511 Pain in right shoulder: Secondary | ICD-10-CM

## 2011-01-22 DIAGNOSIS — F17200 Nicotine dependence, unspecified, uncomplicated: Secondary | ICD-10-CM

## 2011-01-22 DIAGNOSIS — Z124 Encounter for screening for malignant neoplasm of cervix: Secondary | ICD-10-CM

## 2011-01-22 DIAGNOSIS — I10 Essential (primary) hypertension: Secondary | ICD-10-CM

## 2011-01-22 LAB — BASIC METABOLIC PANEL
BUN: 10 mg/dL (ref 6–23)
Creat: 0.92 mg/dL (ref 0.50–1.10)
Glucose, Bld: 63 mg/dL — ABNORMAL LOW (ref 70–99)

## 2011-01-22 MED ORDER — ACETAMINOPHEN ER 650 MG PO TBCR: 650.0000 mg | EXTENDED_RELEASE_TABLET | Freq: Three times a day (TID) | ORAL | Status: AC | PRN

## 2011-01-22 MED ORDER — ENALAPRIL MALEATE 5 MG PO TABS: 5.0000 mg | ORAL_TABLET | Freq: Every day | ORAL | Status: DC

## 2011-01-22 MED ORDER — MELOXICAM 15 MG PO TABS: 15.0000 mg | ORAL_TABLET | Freq: Every day | ORAL | Status: DC

## 2011-01-22 MED ORDER — SUMATRIPTAN SUCCINATE 100 MG PO TABS: 100.0000 mg | ORAL_TABLET | ORAL | Status: DC | PRN

## 2011-01-22 MED ORDER — CYCLOBENZAPRINE HCL 10 MG PO TABS: 10.0000 mg | ORAL_TABLET | Freq: Three times a day (TID) | ORAL | Status: DC | PRN

## 2011-01-22 MED ORDER — TOPIRAMATE 50 MG PO TABS: 50.0000 mg | ORAL_TABLET | Freq: Every evening | ORAL | Status: DC

## 2011-01-22 MED ORDER — BECLOMETHASONE DIPROPIONATE 80 MCG/ACT IN AERS: 2.0000 | INHALATION_SPRAY | Freq: Two times a day (BID) | RESPIRATORY_TRACT | Status: DC

## 2011-01-22 MED ORDER — HYDROCHLOROTHIAZIDE 25 MG PO TABS: 25.0000 mg | ORAL_TABLET | Freq: Every day | ORAL | Status: DC

## 2011-01-22 MED ORDER — ALBUTEROL 90 MCG/ACT IN AERS: 2.0000 | INHALATION_SPRAY | Freq: Four times a day (QID) | RESPIRATORY_TRACT | Status: DC | PRN

## 2011-01-22 NOTE — Assessment & Plan Note (Addendum)
Controlled. Continue HCTZ and enalapril.

## 2011-01-22 NOTE — Assessment & Plan Note (Addendum)
Stable. No recent exacerbations. Last used albuterol inhaler months ago. Continue QVAR at current dose.

## 2011-01-22 NOTE — Assessment & Plan Note (Addendum)
Stable. Topiramate for PPx and sumatriptan prn. Will refill.

## 2011-01-22 NOTE — Patient Instructions (Signed)
It was nice to see you again!  I will mail you with the results of your pap smear.   For your shoulder, I think you may have hurt your rotator cuff and there is now inflammation. Try the Mobic and flexeril. Continue ice packs. Try the exercises every day. Call the clinic and let me know if your shoulder is not improved in about 2 weeks or the pain is worse.   Schedule a follow-up appointment with me in 6 months.   Work on the smoking please.   Good luck at school! : )

## 2011-01-24 ENCOUNTER — Encounter: Payer: Self-pay | Admitting: Family Medicine

## 2011-01-24 DIAGNOSIS — Z0000 Encounter for general adult medical examination without abnormal findings: Secondary | ICD-10-CM | POA: Insufficient documentation

## 2011-01-24 NOTE — Assessment & Plan Note (Addendum)
Patient doing well. Engaged and back at school. Discussed smoking cessation. Will follow-up on Pap smear results. Some cervical abnormalities on physical exam. Did not check GC/Chlamydia or ask about history of STI. Will do this if Pap abnormal or at next visit.

## 2011-01-24 NOTE — Progress Notes (Signed)
  Subjective:    Patient ID: Connie Black, female    DOB: 1965-10-12, 45 y.o.   MRN: [id number]  HPI Here for annual physical. 1. Migraine. Controlled on topamax.  2. HTN Compliant with medications. ROS: no chest pain, difficulty urinating, headaches besides usual migraines.  3. Bipolar disorder Stable.  No manic/depressive episodes recently.   4. Right shoulder pain  Past 2 weeks. Unsure what could have caused pain. Denies trauma.  Hurts with moving shoulder. Sharp pain. At worse 8/10. Improved 3/10 with ice. ROS: denies fevers  Review of Systems    Objective:   Physical Exam General: NAD CV: RRR, normal S1/S2 with no MRG Pulm: CTAB Abd: NABS, soft, non-tender Ext: no edema, warm, 2+ pedal pulses MSK: R shoulder: pain with passive abduction (up to 135 degrees), and extension (up to 60 degrees), acute TTP at subacromial joint; L shoulder no TTP and no decreased ROM Skin: no rashes, erythema or other lesions   GU: no vulvar lesions; no vaginal erythma or legions; some redness around lip of the cervix; no bleeding; thin discharge on posterior vault   Assessment & Plan:

## 2011-01-24 NOTE — Assessment & Plan Note (Addendum)
New problem. Likely rotator cuff tendinitis from frequent use; no acute exacerbating event. Concern for tear based on duration of symptoms (2-weeks) without improvement. Will manage conservatively for now per patient request. Will try short-course (2-week) Mobic. Recommend continuing to ice shoulder. Given exercises to prevent frozen shoulder; stressed need to keep shoulder active. If not improved in 2-weeks or worsens, patient asked to call; will refer to Sports Medicine for ultrasound and to evaluate for tear that time.

## 2011-02-01 ENCOUNTER — Emergency Department (INDEPENDENT_AMBULATORY_CARE_PROVIDER_SITE_OTHER): Payer: Medicaid Other

## 2011-02-01 ENCOUNTER — Emergency Department (HOSPITAL_BASED_OUTPATIENT_CLINIC_OR_DEPARTMENT_OTHER)
Admission: EM | Admit: 2011-02-01 | Discharge: 2011-02-01 | Disposition: A | Discharge status: Home / Self Care | Payer: Medicaid Other | Attending: Emergency Medicine | Admitting: Emergency Medicine

## 2011-02-01 DIAGNOSIS — F319 Bipolar disorder, unspecified: Secondary | ICD-10-CM | POA: Insufficient documentation

## 2011-02-01 DIAGNOSIS — Z79899 Other long term (current) drug therapy: Secondary | ICD-10-CM | POA: Insufficient documentation

## 2011-02-01 DIAGNOSIS — R079 Chest pain, unspecified: Secondary | ICD-10-CM

## 2011-02-01 DIAGNOSIS — I959 Hypotension, unspecified: Secondary | ICD-10-CM | POA: Insufficient documentation

## 2011-02-01 DIAGNOSIS — R0602 Shortness of breath: Secondary | ICD-10-CM | POA: Insufficient documentation

## 2011-02-01 DIAGNOSIS — R55 Syncope and collapse: Secondary | ICD-10-CM

## 2011-02-01 DIAGNOSIS — J45909 Unspecified asthma, uncomplicated: Secondary | ICD-10-CM | POA: Insufficient documentation

## 2011-02-01 DIAGNOSIS — G8929 Other chronic pain: Secondary | ICD-10-CM | POA: Insufficient documentation

## 2011-02-01 LAB — URINALYSIS, ROUTINE W REFLEX MICROSCOPIC
Bilirubin Urine: NEGATIVE
Glucose, UA: NEGATIVE mg/dL
Specific Gravity, Urine: 1.016 (ref 1.005–1.030)
Urobilinogen, UA: 1 mg/dL (ref 0.0–1.0)
pH: 5.5 (ref 5.0–8.0)

## 2011-02-01 LAB — BASIC METABOLIC PANEL
BUN: 16 mg/dL (ref 6–23)
Calcium: 9.8 mg/dL (ref 8.4–10.5)
Creatinine, Ser: 1.2 mg/dL — ABNORMAL HIGH (ref 0.50–1.10)
GFR calc Af Amer: 59 mL/min — ABNORMAL LOW (ref >60)
GFR calc non Af Amer: 49 mL/min — ABNORMAL LOW (ref >60)

## 2011-02-01 LAB — CBC
Hemoglobin: 13.5 g/dL (ref 12.0–15.0)
MCHC: 35.8 g/dL (ref 30.0–36.0)
Platelets: 247 10*3/uL (ref 150–400)
RBC: 4.19 MIL/uL (ref 3.87–5.11)

## 2011-02-01 LAB — DIFFERENTIAL
Basophils Absolute: 0 10*3/uL (ref 0.0–0.1)
Basophils Relative: 0 % (ref 0–1)
Eosinophils Absolute: 0.3 10*3/uL (ref 0.0–0.7)
Monocytes Absolute: 0.5 10*3/uL (ref 0.1–1.0)
Neutro Abs: 3.7 10*3/uL (ref 1.7–7.7)
Neutrophils Relative %: 56 % (ref 43–77)

## 2011-02-01 LAB — CK TOTAL AND CKMB (NOT AT ARMC): Total CK: 56 U/L (ref 7–177)

## 2011-02-01 LAB — TROPONIN I: Troponin I: <0.3 ng/mL (ref <0.30)

## 2011-02-01 LAB — PREGNANCY, URINE: Preg Test, Ur: NEGATIVE

## 2011-02-01 LAB — URINE MICROSCOPIC-ADD ON

## 2011-02-01 MED ORDER — IOHEXOL 350 MG/ML SOLN
100.0000 mL | Freq: Once | INTRAVENOUS | Status: AC | PRN
  Administered 2011-02-01: 100 mL via INTRAVENOUS

## 2011-02-04 ENCOUNTER — Ambulatory Visit (INDEPENDENT_AMBULATORY_CARE_PROVIDER_SITE_OTHER): Payer: Medicaid Other | Admitting: Sports Medicine

## 2011-02-04 VITALS — BP 104/60 | HR 88 | Wt 102.9 lb

## 2011-02-04 DIAGNOSIS — Z87898 Personal history of other specified conditions: Secondary | ICD-10-CM

## 2011-02-04 LAB — COMPREHENSIVE METABOLIC PANEL
ALT: 15 U/L (ref 0–35)
AST: 19 U/L (ref 0–37)
Albumin: 4.8 g/dL (ref 3.5–5.2)
Alkaline Phosphatase: 76 U/L (ref 39–117)
BUN: 11 mg/dL (ref 6–23)
Calcium: 9.8 mg/dL (ref 8.4–10.5)
Chloride: 102 mEq/L (ref 96–112)
Potassium: 3.8 mEq/L (ref 3.5–5.3)

## 2011-02-04 LAB — CBC
HCT: 38.8 % (ref 36.0–46.0)
MCHC: 35.1 g/dL (ref 30.0–36.0)
Platelets: 246 10*3/uL (ref 150–400)
RDW: 12.6 % (ref 11.5–15.5)
WBC: 8.2 10*3/uL (ref 4.0–10.5)

## 2011-02-04 MED ORDER — NITROGLYCERIN 0.4 MG SL SUBL: 0.4000 mg | SUBLINGUAL_TABLET | SUBLINGUAL | Status: DC | PRN

## 2011-02-04 NOTE — Assessment & Plan Note (Signed)
CP, atypical. CBC, CMET, TSH, UDS Stop HCTZ for now with hypoK and soft BPs (RTC 2 weeks to recheck) ETT 24h holter

## 2011-02-04 NOTE — Progress Notes (Signed)
  Subjective:    Patient ID: Connie Black, female    DOB: March 24, 1966, 45 y.o.   MRN: [id number]  HPI Recently in ED w CP/syncope.  Notes episodes since 5d ago.  Random, no association with exertion (can push cart around [employer] without symptoms) gets episodes of CP, palpitations, L arm pain, nausea, presyncope.  No diaphoresis.  Was given NTG in ED and symptoms did improve.  Worse with getting up from sitting.  Were occuring 7-8x a day, now 4x a day, lasts minutes and self resolves.  Went to ED, w/u neg, CE neg, ECG sinus tachy, D-Dimer high but CTA neg, No SOB/cough/wheeze.  She has a hx bulimia, hx cocaine use but has been clean for 7 yrs.     Review of Systems    See HPI Objective:   Physical Exam  Constitutional: She appears well-developed. No distress.       Thin AAF  HENT:  Head: Normocephalic and atraumatic.  Eyes: Conjunctivae and EOM are normal. Pupils are equal, round, and reactive to light.  Neck: Neck supple. No JVD present. No thyromegaly present.  Cardiovascular: Normal rate, regular rhythm and normal heart sounds.  Exam reveals no gallop and no friction rub.   No murmur heard. Pulmonary/Chest: Effort normal. No respiratory distress. She has no wheezes. She has no rales. She exhibits no tenderness.  Musculoskeletal: She exhibits no edema.  Lymphadenopathy:    She has no cervical adenopathy.  Skin: Skin is warm and dry.          Assessment & Plan:

## 2011-02-04 NOTE — Patient Instructions (Addendum)
Great to see you. Checking some bloodwork. Exercise stress test. Holter monitor. STOP hydrochlorothiazide for now. Nitroglycerin for chest pain. Come back to see [place] in 2 weeks.  Ihor Austin. Benjamin Stain, M.D. [facility] [address], [place] [address] [phone]

## 2011-02-05 ENCOUNTER — Ambulatory Visit (INDEPENDENT_AMBULATORY_CARE_PROVIDER_SITE_OTHER): Payer: Medicaid Other | Admitting: *Deleted

## 2011-02-05 DIAGNOSIS — Z87898 Personal history of other specified conditions: Secondary | ICD-10-CM

## 2011-02-05 LAB — DRUG SCREEN, URINE
Barbiturate Quant, Ur: NEGATIVE
Cocaine Metabolites: NEGATIVE
Marijuana Metabolite: POSITIVE — AB
Opiates: NEGATIVE
Phencyclidine (PCP): NEGATIVE
Propoxyphene: NEGATIVE

## 2011-02-05 NOTE — Progress Notes (Signed)
Holter Monitor removed. Taken to EKG lab at hospital.

## 2011-02-18 ENCOUNTER — Ambulatory Visit: Payer: Medicaid Other | Admitting: Family Medicine

## 2011-03-06 ENCOUNTER — Ambulatory Visit: Payer: Medicaid Other | Admitting: Family Medicine

## 2011-05-28 ENCOUNTER — Other Ambulatory Visit: Payer: Self-pay | Admitting: Family Medicine

## 2011-05-28 DIAGNOSIS — Z1231 Encounter for screening mammogram for malignant neoplasm of breast: Secondary | ICD-10-CM

## 2011-06-14 ENCOUNTER — Encounter: Payer: Self-pay | Admitting: Family Medicine

## 2011-06-14 ENCOUNTER — Ambulatory Visit (INDEPENDENT_AMBULATORY_CARE_PROVIDER_SITE_OTHER): Payer: Medicaid Other | Admitting: Family Medicine

## 2011-06-14 DIAGNOSIS — F1910 Other psychoactive substance abuse, uncomplicated: Secondary | ICD-10-CM

## 2011-06-14 DIAGNOSIS — R630 Anorexia: Secondary | ICD-10-CM

## 2011-06-14 DIAGNOSIS — M549 Dorsalgia, unspecified: Secondary | ICD-10-CM

## 2011-06-14 MED ORDER — CYCLOBENZAPRINE HCL 10 MG PO TABS: 10.0000 mg | ORAL_TABLET | Freq: Three times a day (TID) | ORAL | Status: DC | PRN

## 2011-06-14 NOTE — Patient Instructions (Signed)
Please go to [facility] or [facility] for x-rays of your back. Try the flexeril for your back pain.   I will call you with the imaging results. If you do not have a fracture, I will refer you back to Dr. Magnus Ivan for injections.  I am sorry you are going through so much.  Please make an appointment to see the doctors at [facility] today for an appointment as soon as conveniently possible.  Please follow-up with me in 2 weeks.   If you feel unsafe, please call Bonita Quin or me.

## 2011-06-14 NOTE — Progress Notes (Signed)
  Subjective:    Patient ID: Connie Black, female    DOB: Oct 28, 1965, 45 y.o.   MRN: [id number]  HPI Patient here today due to worsening of chronic back pain. Asking for cartilage injections. She had it last done 4 years ago. Had 2 sessions and it usually helps her pain for 2 years.  MRI 2010 shows small left foraminal lumbar disc protrusion. Feels like her typical back pain. Not radiculpathic symptoms. No urinary or bladder incontinence. Sometimes difficulty walking due to the pain. Movement of her back and lying flat on her back exacerbates symptoms. Alleviated by lying on the side. NSAIDs, Tylenol do not help pain. Currently not taking anything for pain. Has not taken Flexeril for a while.   2. 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  Also started eating less. Has not thrown-up though. Lots of stressors at home. Stress between her and her son who moved back home after finding his girlfriend had been cheating on him for 1-2 years.  Also, she had to drop out of business classes from school due to inability to concentrate. Requesting Adderall today. Had been on this previously.  Review of Systems Per HPI with inclusion of following: denies fevers.    Objective:   Physical Exam Gen: appears uncomfortable, prefers lying on side on exam table Psych: speech is mildly slurred; appropriate to questions, but does not seem fully engaged during interview; appears depressed/down; does not maintain gaze with me CV: RRR Back: moderate-severe tenderness over bony prominences of lumbar spine; lumbosacral muscles feel tight; flexion/extension of back limited by pain Neuro: gait is normal, strength is intact 5/5 lower extremities, sensation is intact    Assessment & Plan:

## 2011-06-16 NOTE — Assessment & Plan Note (Signed)
Relapsed. Used cocaine twice about a month ago since being clean for about 6 years.  Therapist aware of this.

## 2011-06-16 NOTE — Assessment & Plan Note (Addendum)
Due to her drop in BMI and history of anorexia and now worsening of her chronic back pain, concern for vertebral fracture.  Will order x-ray of your lumbar spine.  If negative, will refer her back to her orthopedist for cartilage injections, which seem to help her. She has a history of mild lumbar disc herniations. She says usually 2 injections keep her pain well under control for 2 years.  She is currently not taking pain medication and says Tylenol and ibuprofen do not help.  Will re-new flexeril prescription today to help with muscle spasm. Also recommended warm compresses.  Will not prescribe anything stronger at this time until the x-ray results come back.   Update: left message with patient today (10/30). X-rays negative for fracture. Since pain is consistent with exacerbation of chronic pain attributed to lumbar disc herniations, and patient does not have any radiculopathic or other worrisome symptoms at this time, advised her to call Dr. Eliberto Ivory office [facility]) who recommended back injections for her in the past for an appointment. Asked her to call our office if she had any problems getting an appointment. Last time she had an injection was 04/2009.

## 2011-06-16 NOTE — Assessment & Plan Note (Addendum)
Due to recent stressors over the past few months, started losing weight again and her BMI is now 15, which is lower than previously recorded BMIs which are usually in the 17s. Denies currently purging. She is followed closely by a therapist who sees her weekly. She reports therapist is aware of this. Denies wanting any inpatient treatment at this time.  Update: spoke with therapist Bonita Quin this morning (10/29). Bonita Quin says that getting her family involved has been helpful in the past. We will both monitor her weight closely. I will confirm that she has made an appointment with [facility] at her follow-up with me in a couple of weeks. Patient missed her appointment with Bonita Quin last week and so does not currently have a follow-up appointment; Bonita Quin said she would call patient to make one.

## 2011-06-17 ENCOUNTER — Telehealth: Payer: Self-pay | Admitting: Family Medicine

## 2011-06-17 ENCOUNTER — Ambulatory Visit
Admission: RE | Admit: 2011-06-17 | Discharge: 2011-06-17 | Disposition: A | Discharge status: Home / Self Care | Payer: Medicaid Other | Source: Ambulatory Visit | Attending: Family Medicine | Admitting: Family Medicine

## 2011-06-17 DIAGNOSIS — M549 Dorsalgia, unspecified: Secondary | ICD-10-CM

## 2011-06-17 NOTE — Telephone Encounter (Signed)
Patient wanted to let you know that [facility] said that you can request chromosone shots.

## 2011-07-03 ENCOUNTER — Ambulatory Visit: Payer: Medicaid Other

## 2011-07-05 ENCOUNTER — Encounter: Payer: Self-pay | Admitting: Family Medicine

## 2011-07-05 ENCOUNTER — Ambulatory Visit (INDEPENDENT_AMBULATORY_CARE_PROVIDER_SITE_OTHER): Payer: Medicaid Other | Admitting: Family Medicine

## 2011-07-05 VITALS — BP 103/71 | HR 94 | Temp 98.3°F | Ht 65.5 in | Wt 98.0 lb

## 2011-07-05 DIAGNOSIS — R630 Anorexia: Secondary | ICD-10-CM

## 2011-07-05 DIAGNOSIS — I10 Essential (primary) hypertension: Secondary | ICD-10-CM

## 2011-07-05 DIAGNOSIS — M549 Dorsalgia, unspecified: Secondary | ICD-10-CM

## 2011-07-05 MED ORDER — MEGESTROL ACETATE 625 MG/5ML PO SUSP: 625.0000 mg | Freq: Every day | ORAL | Status: AC

## 2011-07-05 NOTE — Progress Notes (Signed)
  Subjective:    Patient ID: Connie Black, female    DOB: 07/13/1966, 45 y.o.   MRN: [id number]  HPI 1. Back pain Persistent over lumbar spine Denies numbness, bladder/bowel incontinence  2. Anorexia, depression Feels stress in her life is improved but still under significant stress due to son's girlfriend. She feels she is causing significant tension between her son and his brother (her other son). Her son remains in the relationship due to their having an 50-year-old daughter.  She reports not being hungry. Denies being fearful of food. Denies binging. ROS: denies SI/HI, hopelessness.  3. History of drug use Has not used any illicit drugs since relapse several weeks ago Does not crave it  4. History of HTN Reports compliance with medication HCTZ ROS: denies dizziness  Review of Systems Per HPI    Objective:   Physical Exam Gen: thin CV: mildly dry MM Back: mild-moderate tenderness lumbar spine; no paraspinous tenderness Ext: sensation intact, no edema, 3-4 sec capillary refills    Assessment & Plan:

## 2011-07-05 NOTE — Assessment & Plan Note (Signed)
BP low today. Appears slightly dehydrated. BPs usually on the higher side. Continue HCTZ for now. Denies symptoms of hypotension. Will re-evaluate next visit in 1 month.

## 2011-07-05 NOTE — Patient Instructions (Addendum)
We will refer you to a neurosurgeon for your back pain.   Try the Megace for your appetite.   Follow-up with me in 1 month.

## 2011-07-05 NOTE — Assessment & Plan Note (Addendum)
Persistent. 04/2009 MRI showed small left foraminal disc protrusion at L4-L5 that might be causing L5 radiculitis. She has had epidural steroid injections in the past, last time 02/2009, that have helped her and she reiterates that she would like this again.  Will refer to pain physician Dr. Murray Hodgkins at [facility].

## 2011-07-05 NOTE — Assessment & Plan Note (Signed)
Weight is stable today.  Patient reports just not being that hungry. She denies being fearful of eating.  Patient asking something to stimulate appetite. Will start Megace. Also recommended high calorie foods. Will follow-up in 1 month.

## 2011-07-09 ENCOUNTER — Telehealth: Payer: Self-pay | Admitting: Family Medicine

## 2011-07-09 NOTE — Telephone Encounter (Signed)
Pt calling re: med, megace, says ins didn't approve the med and wants to know if we can do a prior auth to get it approved? [facility]

## 2011-07-09 NOTE — Telephone Encounter (Signed)
Leighton Parody you want Larita Fife to start this prior approval or do you want to try something else?

## 2011-07-10 NOTE — Telephone Encounter (Signed)
Yes please

## 2011-07-15 ENCOUNTER — Telehealth: Payer: Self-pay | Admitting: Family Medicine

## 2011-07-15 ENCOUNTER — Telehealth: Payer: Self-pay | Admitting: *Deleted

## 2011-07-15 NOTE — Telephone Encounter (Signed)
Spoke with patient, informed her that [facility] refused to see her and that I was faxing a new referral to [facility] and would know something later this week, expressed understanding. Patient wants to know status of prior authorization for Megace that was sent in at last visit, will forward to MD.

## 2011-07-15 NOTE — Telephone Encounter (Signed)
PA required for megace . Form placed in MD box.

## 2011-07-15 NOTE — Telephone Encounter (Signed)
Pt checking status of referral to neuro surgeon.

## 2011-07-16 NOTE — Telephone Encounter (Signed)
Filled and put in "to be faxed" box.   Could not find exact appropriate indication but wrote how Megace is unique medication, no alternative I am aware of . If there is another alternative not requiring PA I would consider it. I also indicated how patient is severely anorexic and already malnourished and is at risk for developing significant co-morbidities associated with her anorexia, notably osteoporosis.

## 2011-07-16 NOTE — Telephone Encounter (Signed)
Connie Black told me yesterday PA form is in my box. I will fill out tomorrow and fax it. Will you please let patient know this?  Thank you Asha.

## 2011-07-17 NOTE — Telephone Encounter (Signed)
Left message informing patient of message from MD, also asked patient to give me a call back regarding whether or not she would be willing to travel to [facility] or [place] for neurosurgery referral as we have been unable to get her in with one in the area.

## 2011-07-18 NOTE — Telephone Encounter (Signed)
Approval received from [facility] for Megace. Pharmacy notified.

## 2011-07-23 ENCOUNTER — Telehealth: Payer: Self-pay | Admitting: Family Medicine

## 2011-07-23 NOTE — Telephone Encounter (Signed)
Her thighs are tingling and passed urine without feeling this.  Patient will go to ed for visit.  And still haven't heard anything from neurology.

## 2011-07-24 ENCOUNTER — Encounter (HOSPITAL_COMMUNITY): Payer: Self-pay | Admitting: Emergency Medicine

## 2011-07-24 ENCOUNTER — Emergency Department (INDEPENDENT_AMBULATORY_CARE_PROVIDER_SITE_OTHER)
Admission: EM | Admit: 2011-07-24 | Discharge: 2011-07-24 | Disposition: A | Discharge status: Home / Self Care | Payer: Medicaid Other | Source: Home / Self Care | Attending: Emergency Medicine | Admitting: Emergency Medicine

## 2011-07-24 DIAGNOSIS — R6889 Other general symptoms and signs: Secondary | ICD-10-CM

## 2011-07-24 DIAGNOSIS — M549 Dorsalgia, unspecified: Secondary | ICD-10-CM

## 2011-07-24 HISTORY — DX: Radiculopathy, lumbar region: M54.16

## 2011-07-24 LAB — POCT URINALYSIS DIP (DEVICE)
Hgb urine dipstick: NEGATIVE
Ketones, ur: NEGATIVE mg/dL
Protein, ur: 30 mg/dL — AB
Specific Gravity, Urine: 1.015 (ref 1.005–1.030)
Urobilinogen, UA: 4 mg/dL — ABNORMAL HIGH (ref 0.0–1.0)
pH: 6.5 (ref 5.0–8.0)

## 2011-07-24 MED ORDER — OSELTAMIVIR PHOSPHATE 75 MG PO CAPS: 75.0000 mg | ORAL_CAPSULE | Freq: Two times a day (BID) | ORAL | Status: AC

## 2011-07-24 NOTE — ED Provider Notes (Addendum)
History     CSN: [id number] Arrival date & time: 07/24/2011  1:16 PM   First MD Initiated Contact with Patient 07/24/11 1418      Chief Complaint  Patient presents with  . Influenza  . Fever    (Consider location/radiation/quality/duration/timing/severity/associated sxs/prior treatment) HPI Comments: COUGHING AND WITH FEVERS, RUNNY NOSE, AND BODY ACHES, SOB ALSO WITH BACK PAIN, AND URINARY SYMPTOMS.  Patient is a 45 y.o. female presenting with flu symptoms, fever, and dysuria. The history is provided by the patient.  Influenza This is a new problem. The current episode started 3 to 5 hours ago. The problem occurs constantly. The problem has been resolved. Pertinent negatives include no chest pain, no abdominal pain, no headaches and no shortness of breath. The symptoms are aggravated by nothing. The symptoms are relieved by nothing.  Fever Primary symptoms of the febrile illness include fever. Primary symptoms do not include headaches, shortness of breath, abdominal pain, vomiting or dysuria.  Dysuria  This is a recurrent problem. The problem occurs every urination. Associated symptoms include frequency. Pertinent negatives include no chills, no vomiting and no flank pain. She has tried nothing for the symptoms.    Past Medical History  Diagnosis Date  . Asthma     History reviewed. No pertinent past surgical history.  Family History  Problem Relation Age of Onset  . Cancer Other     History  Substance Use Topics  . Smoking status: Former Smoker -- 1.0 packs/day    Types: Cigarettes  . Smokeless tobacco: Not on file   Comment: is trying to quitt  . Alcohol Use: No    OB History    Grav Para Term Preterm Abortions TAB SAB Ect Mult Living                  Review of Systems  Constitutional: Positive for fever. Negative for chills, activity change and appetite change.  Respiratory: Negative for shortness of breath.   Cardiovascular: Negative for chest pain.    Gastrointestinal: Negative for vomiting and abdominal pain.  Genitourinary: Positive for frequency. Negative for dysuria and flank pain.  Neurological: Negative for headaches.    Allergies  Morphine  Home Medications   Current Outpatient Rx  Name Route Sig Dispense Refill  . ALBUTEROL 90 MCG/ACT IN AERS Inhalation Inhale 2 puffs into the lungs every 6 (six) hours as needed. 17 g 6  . BECLOMETHASONE DIPROPIONATE 80 MCG/ACT IN AERS Inhalation Inhale 2 puffs into the lungs 2 (two) times daily. For asthma. Dispense QS 1 month 1 Inhaler 6  . CYCLOBENZAPRINE HCL 10 MG PO TABS Oral Take 1 tablet (10 mg total) by mouth every 8 (eight) hours as needed for muscle spasms. 30 tablet 0  . HYDROCHLOROTHIAZIDE 25 MG PO TABS Oral Take 1 tablet (25 mg total) by mouth daily. 30 tablet 6  . HYDROXYZINE PAMOATE 25 MG PO CAPS Oral Take 25 mg by mouth every 6 (six) hours as needed.      . MEGESTROL ACETATE 625 MG/5ML PO SUSP Oral Take 5 mLs (625 mg total) by mouth daily. To stimulate your appetite. 150 mL 0  . NITROGLYCERIN 0.4 MG SL SUBL Sublingual Place 1 tablet (0.4 mg total) under the tongue every 5 (five) minutes as needed for chest pain. 100 tablet 3  . OSELTAMIVIR PHOSPHATE 75 MG PO CAPS Oral Take 1 capsule (75 mg total) by mouth 2 (two) times daily. 10 capsule 0  . QUETIAPINE FUMARATE 200 MG PO TABS  Oral Take 600 mg by mouth at bedtime.      . SUMATRIPTAN SUCCINATE 100 MG PO TABS Oral Take 1 tablet (100 mg total) by mouth every 2 (two) hours as needed for migraine. Take 1 pill when you feel migraines beginning.  If no relief in 2 hours, you make take another pill.  Do not take more than 2 pills daily. 10 tablet 3  . TOPIRAMATE 50 MG PO TABS Oral Take 1 tablet (50 mg total) by mouth every evening. To prevent migrains 30 tablet 6    BP 118/88  Pulse 114  Temp(Src) 99.1 F (37.3 C) (Tympanic)  Resp 16  SpO2 98%  Physical Exam  Nursing note and vitals reviewed. Constitutional: She appears  well-developed and well-nourished. No distress.  HENT:  Nose: Rhinorrhea present.  Mouth/Throat: Uvula is midline and mucous membranes are normal. Posterior oropharyngeal erythema present. No oropharyngeal exudate.  Neck: No JVD present. No Kernig's sign noted.  Cardiovascular: Regular rhythm and normal pulses.   No extrasystoles are present. Tachycardia present.   Pulmonary/Chest: Effort normal and breath sounds normal. She has no decreased breath sounds. She has no wheezes. She has no rhonchi. She has no rales.  Abdominal: Soft. There is no tenderness.  Musculoskeletal: Normal range of motion.  Neurological: She is alert. She has normal strength. No cranial nerve deficit or sensory deficit. She exhibits normal muscle tone.  Skin: Skin is warm.       ED Course  Procedures (including critical care time)  Labs Reviewed  POCT URINALYSIS DIP (DEVICE) - Abnormal; Notable for the following:    Protein, ur 30 (*)    Urobilinogen, UA 4.0 (*)    All other components within normal limits   No results found.   1. Back pain   2. Influenza-like symptoms       MDM  MULTIPLE SYMPTOMS- DYSPNEA URI SXS, AND DYSURIA WITH CHRONIC BACK PAIN.        Jimmie Molly, MD 07/24/11 1610  Jimmie Molly, MD 07/24/11 [phone]

## 2011-07-24 NOTE — ED Notes (Signed)
Asked to check pt per family member. Pt asleep in chairs when went over, responded to namecall. Spoke with pt, states has back pain, fever and urinary sx.  In no distress, checked approx 1 hour ago by Hyman Bible RN, AD.  Instructed them there were 7 people ahead of them and we would get them back ASAP.  If any changes instructed to notify registration staff.

## 2011-07-24 NOTE — ED Notes (Signed)
Pt here with c/o flu like sx and fevers 102-104 x 3 dys and no fever reducer taken.pt sx dry cough,chills and body aches and increased upper back pain r/t x 2 crqacked disc but with coughing the pain is worse.denies vomiting.temp on admit 99.1

## 2011-07-24 NOTE — ED Notes (Signed)
Pt also reports to chest tightness and congestion

## 2011-07-24 NOTE — ED Notes (Signed)
I was called to the front by Madelaine Bhat, registration clerk to check on pt c/o sob.  Pt has been here for approx 30 min prior to coming to front desk c/o sob.  When I spoke with the patient she states she was in a lot of pain due to her bulging discs in her back and her PCP told her to come here.  I told her that I could not bring her back ahead of others because of this complaint, but we would get her back as soon as we could.  Then she told me that she had asthma and was having trouble breathing.  Pt was speaking in complete sentences prior to telling me this.  She is now talking more choppy.  I listened to her breath sounds and she was clear bilaterally.  I told her this and told her that we would see her as soon as we could, but there was not a need to take her back now.

## 2011-07-24 NOTE — Telephone Encounter (Signed)
Left another voicemail for patient regarding neuro referral (see phone note from 07/15/11). Asked that patient call me back.

## 2011-07-25 NOTE — Telephone Encounter (Signed)
Pt has still not heard anything from the neurosurgeon

## 2011-07-25 NOTE — Telephone Encounter (Signed)
Pt calling again, says she was discharged from the neursurgeons office in 2005, wants Asha to call her back.

## 2011-07-25 NOTE — Telephone Encounter (Signed)
Spoke with patient and informed her that our next option would be a neuro office in [facility] or [place], patient agreeable to this. Will attmept to refer and contact patient.

## 2011-07-26 ENCOUNTER — Other Ambulatory Visit: Payer: Self-pay | Admitting: Family Medicine

## 2011-07-26 DIAGNOSIS — M545 Low back pain: Secondary | ICD-10-CM

## 2011-07-26 NOTE — Telephone Encounter (Signed)
New referral faxed to [facility] in [place], [place]. Awaiting response.

## 2011-07-26 NOTE — Telephone Encounter (Signed)
Informed patient that we needed a recent MRI before [facility] would schedule her, appt for MRI shceduled for 08/01/11 at 4:45pm at [facility] radiology dept. Patient informed expressed understanding.

## 2011-07-31 ENCOUNTER — Telehealth: Payer: Self-pay | Admitting: Family Medicine

## 2011-07-31 NOTE — Telephone Encounter (Signed)
Patient informed that MD was out today so she may not receive before her appt., but I would send message to MD. Expressed understanding.

## 2011-07-31 NOTE — Telephone Encounter (Signed)
Pt scheduled for MRI tomorrow, wants to know if MD can send in the sedative to [facility]

## 2011-08-01 ENCOUNTER — Ambulatory Visit (HOSPITAL_COMMUNITY)
Admission: RE | Admit: 2011-08-01 | Discharge: 2011-08-01 | Disposition: A | Discharge status: Home / Self Care | Payer: Medicaid Other | Source: Ambulatory Visit | Attending: Family Medicine | Admitting: Family Medicine

## 2011-08-01 DIAGNOSIS — M5126 Other intervertebral disc displacement, lumbar region: Secondary | ICD-10-CM | POA: Insufficient documentation

## 2011-08-01 DIAGNOSIS — M545 Low back pain, unspecified: Secondary | ICD-10-CM | POA: Insufficient documentation

## 2011-08-01 DIAGNOSIS — M5137 Other intervertebral disc degeneration, lumbosacral region: Secondary | ICD-10-CM | POA: Insufficient documentation

## 2011-08-01 DIAGNOSIS — M51379 Other intervertebral disc degeneration, lumbosacral region without mention of lumbar back pain or lower extremity pain: Secondary | ICD-10-CM | POA: Insufficient documentation

## 2011-08-01 MED ORDER — LORAZEPAM 2 MG PO TABS: 2.0000 mg | ORAL_TABLET | Freq: Once | ORAL | Status: AC

## 2011-08-01 NOTE — Telephone Encounter (Signed)
Called in Rx for Ativan. Informed patient.

## 2011-09-25 ENCOUNTER — Emergency Department (HOSPITAL_COMMUNITY)
Admission: EM | Admit: 2011-09-25 | Discharge: 2011-09-28 | Disposition: A | Discharge status: Home / Self Care | Payer: Medicaid Other | Attending: Emergency Medicine | Admitting: Emergency Medicine

## 2011-09-25 ENCOUNTER — Encounter (HOSPITAL_COMMUNITY): Payer: Self-pay | Admitting: Emergency Medicine

## 2011-09-25 ENCOUNTER — Ambulatory Visit (HOSPITAL_COMMUNITY)
Admission: RE | Admit: 2011-09-25 | Discharge: 2011-09-25 | Disposition: A | Discharge status: Home / Self Care | Payer: Medicaid Other | Source: Home / Self Care | Attending: Psychiatry | Admitting: Psychiatry

## 2011-09-25 DIAGNOSIS — R45851 Suicidal ideations: Secondary | ICD-10-CM | POA: Insufficient documentation

## 2011-09-25 DIAGNOSIS — G43909 Migraine, unspecified, not intractable, without status migrainosus: Secondary | ICD-10-CM | POA: Insufficient documentation

## 2011-09-25 DIAGNOSIS — F319 Bipolar disorder, unspecified: Secondary | ICD-10-CM | POA: Insufficient documentation

## 2011-09-25 DIAGNOSIS — Z79899 Other long term (current) drug therapy: Secondary | ICD-10-CM | POA: Insufficient documentation

## 2011-09-25 DIAGNOSIS — J45909 Unspecified asthma, uncomplicated: Secondary | ICD-10-CM | POA: Insufficient documentation

## 2011-09-25 DIAGNOSIS — Z993 Dependence on wheelchair: Secondary | ICD-10-CM | POA: Insufficient documentation

## 2011-09-25 DIAGNOSIS — IMO0002 Reserved for concepts with insufficient information to code with codable children: Secondary | ICD-10-CM | POA: Insufficient documentation

## 2011-09-25 DIAGNOSIS — E876 Hypokalemia: Secondary | ICD-10-CM | POA: Insufficient documentation

## 2011-09-25 DIAGNOSIS — R443 Hallucinations, unspecified: Secondary | ICD-10-CM

## 2011-09-25 LAB — COMPREHENSIVE METABOLIC PANEL
AST: 15 U/L (ref 0–37)
Albumin: 4.2 g/dL (ref 3.5–5.2)
BUN: 9 mg/dL (ref 6–23)
Calcium: 9.3 mg/dL (ref 8.4–10.5)
Creatinine, Ser: 0.89 mg/dL (ref 0.50–1.10)
Total Bilirubin: 0.4 mg/dL (ref 0.3–1.2)
Total Protein: 7.2 g/dL (ref 6.0–8.3)

## 2011-09-25 LAB — CBC
HCT: 37.6 % (ref 36.0–46.0)
MCH: 32.4 pg (ref 26.0–34.0)
MCHC: 34.8 g/dL (ref 30.0–36.0)
MCV: 93.1 fL (ref 78.0–100.0)
Platelets: 294 10*3/uL (ref 150–400)
RDW: 13.2 % (ref 11.5–15.5)
WBC: 6.6 10*3/uL (ref 4.0–10.5)

## 2011-09-25 LAB — POCT PREGNANCY, URINE: Preg Test, Ur: NEGATIVE

## 2011-09-25 LAB — ACETAMINOPHEN LEVEL: Acetaminophen (Tylenol), Serum: <15 ug/mL (ref 10–30)

## 2011-09-25 LAB — PREGNANCY, URINE: Preg Test, Ur: NEGATIVE

## 2011-09-25 MED ORDER — ZOLPIDEM TARTRATE 5 MG PO TABS: 5.0000 mg | ORAL_TABLET | Freq: Every evening | ORAL | Status: DC | PRN

## 2011-09-25 MED ORDER — ONDANSETRON HCL 4 MG PO TABS: 4.0000 mg | ORAL_TABLET | Freq: Three times a day (TID) | ORAL | Status: DC | PRN

## 2011-09-25 MED ORDER — ALBUTEROL 90 MCG/ACT IN AERS: 2.0000 | INHALATION_SPRAY | Freq: Four times a day (QID) | RESPIRATORY_TRACT | Status: DC | PRN

## 2011-09-25 MED ORDER — SUMATRIPTAN SUCCINATE 25 MG PO TABS
25.0000 mg | ORAL_TABLET | ORAL | Status: DC | PRN
  Administered 2011-09-25 – 2011-09-27 (×3): 25 mg via ORAL
  Filled 2011-09-25 (×3): qty 1

## 2011-09-25 MED ORDER — NICOTINE 21 MG/24HR TD PT24
21.0000 mg | MEDICATED_PATCH | Freq: Every day | TRANSDERMAL | Status: DC
  Administered 2011-09-25 – 2011-09-26 (×2): 21 mg via TRANSDERMAL
  Filled 2011-09-25: qty 1

## 2011-09-25 MED ORDER — ACETAMINOPHEN 325 MG PO TABS
650.0000 mg | ORAL_TABLET | ORAL | Status: DC | PRN
  Administered 2011-09-26: 650 mg via ORAL
  Filled 2011-09-25: qty 2

## 2011-09-25 MED ORDER — ACETAMINOPHEN 325 MG PO TABS: 650.0000 mg | ORAL_TABLET | ORAL | Status: DC | PRN

## 2011-09-25 MED ORDER — ALUM & MAG HYDROXIDE-SIMETH 200-200-20 MG/5ML PO SUSP: 30.0000 mL | ORAL | Status: DC | PRN

## 2011-09-25 MED ORDER — ALBUTEROL SULFATE HFA 108 (90 BASE) MCG/ACT IN AERS: 2.0000 | INHALATION_SPRAY | Freq: Four times a day (QID) | RESPIRATORY_TRACT | Status: DC | PRN

## 2011-09-25 MED ORDER — SUMATRIPTAN SUCCINATE 100 MG PO TABS
100.0000 mg | ORAL_TABLET | ORAL | Status: DC | PRN
  Administered 2011-09-25: 100 mg via ORAL
  Filled 2011-09-25: qty 1

## 2011-09-25 MED ORDER — IBUPROFEN 600 MG PO TABS
600.0000 mg | ORAL_TABLET | Freq: Three times a day (TID) | ORAL | Status: DC | PRN
Start: 2011-09-25 — End: 2011-09-28

## 2011-09-25 MED ORDER — NICOTINE 21 MG/24HR TD PT24
21.0000 mg | MEDICATED_PATCH | Freq: Every day | TRANSDERMAL | Status: DC
  Administered 2011-09-26: 21 mg via TRANSDERMAL
  Filled 2011-09-25: qty 1

## 2011-09-25 MED ORDER — ZOLPIDEM TARTRATE 5 MG PO TABS
5.0000 mg | ORAL_TABLET | Freq: Every evening | ORAL | Status: DC | PRN
  Administered 2011-09-26 – 2011-09-27 (×2): 5 mg via ORAL
  Filled 2011-09-25 (×2): qty 1

## 2011-09-25 MED ORDER — TOPIRAMATE 25 MG PO TABS
50.0000 mg | ORAL_TABLET | Freq: Every evening | ORAL | Status: DC
  Administered 2011-09-25 – 2011-09-27 (×3): 50 mg via ORAL
  Filled 2011-09-25: qty 1
  Filled 2011-09-25: qty 2
  Filled 2011-09-25 (×3): qty 1

## 2011-09-25 MED ORDER — QUETIAPINE FUMARATE 100 MG PO TABS
600.0000 mg | ORAL_TABLET | Freq: Every day | ORAL | Status: DC
  Administered 2011-09-25 – 2011-09-27 (×3): 600 mg via ORAL
  Filled 2011-09-25 (×3): qty 6

## 2011-09-25 MED ORDER — CLONAZEPAM 1 MG PO TABS
1.0000 mg | ORAL_TABLET | Freq: Two times a day (BID) | ORAL | Status: DC | PRN
Start: 2011-09-25 — End: 2011-09-28
  Administered 2011-09-26 – 2011-09-27 (×2): 1 mg via ORAL
  Filled 2011-09-25 (×2): qty 1

## 2011-09-25 MED ORDER — LORAZEPAM 1 MG PO TABS
1.0000 mg | ORAL_TABLET | Freq: Three times a day (TID) | ORAL | Status: DC | PRN
  Administered 2011-09-26 – 2011-09-28 (×4): 1 mg via ORAL
  Filled 2011-09-25 (×4): qty 1

## 2011-09-25 MED ORDER — IBUPROFEN 600 MG PO TABS: 600.0000 mg | ORAL_TABLET | Freq: Three times a day (TID) | ORAL | Status: DC | PRN

## 2011-09-25 NOTE — ED Notes (Signed)
C/o migraine, medicated

## 2011-09-25 NOTE — ED Provider Notes (Signed)
Medical screening examination/treatment/procedure(s) were performed by non-physician practitioner and as supervising physician I was immediately available for consultation/collaboration.   Andrew King, MD 09/25/11 1746 
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
## 2011-09-25 NOTE — ED Notes (Signed)
Pt just had back surgery and told her therapist that she did not want to live anymore if she has to live in a wheelchair, c/o headache, has a hx multiple personalitites

## 2011-09-25 NOTE — ED Notes (Signed)
[id number]> Expected date:09/25/11<BR> Expected time: 2:12 PM<BR> Means of arrival:Ambulance<BR> Comments:<BR> EMS [facility] - asthma

## 2011-09-25 NOTE — ED Notes (Signed)
Personal belongings sent home w/husband, no personal belongings kept

## 2011-09-25 NOTE — ED Provider Notes (Signed)
History     CSN: [id number]  Arrival date & time 09/25/11  1427   First MD Initiated Contact with Patient 09/25/11 1518      5:23 PM HPI Patient reports yesterday she was told that she may need to have a wheelchair for her chronic back pain. Reports this caused severe depression and now has suicidal ideations. Reports a history of suicidal ideations in the past and has required behavioral health admission before. Also reports currently having a migraine headache. Reports headache is typical for chronic migraines and requests another Imitrex. States this will be her second Imitrex. Denies a specific plan for suicide. Denies homicidal ideation. Denies hallucinations or delusions. Reports using cocaine and alcohol. Patient is a 46 y.o. female presenting with mental health disorder. The history is provided by the patient.  Mental Health Problem Primary symptoms comment: Suicidal ideation The current episode started yesterday. This is a new problem.  The onset of the illness is precipitated by a stressful event. Additional symptoms of the illness include feelings of worthlessness and headaches. Additional symptoms of the illness do not include no poor judgment. She admits to suicidal ideas. She does not have a plan to commit suicide. She contemplates harming herself. She has not already injured self. She does not contemplate injuring another person. She has not already  injured another person. Risk factors that are present for mental illness include substance abuse and a history of suicide attempts.    Past Medical History  Diagnosis Date  . Asthma   . Lumbar radiculopathy, chronic     History reviewed. No pertinent past surgical history.  Family History  Problem Relation Age of Onset  . Cancer Other     History  Substance Use Topics  . Smoking status: Former Smoker -- 1.0 packs/day    Types: Cigarettes  . Smokeless tobacco: Not on file   Comment: is trying to quitt  . Alcohol Use: No      OB History    Grav Para Term Preterm Abortions TAB SAB Ect Mult Living                  Review of Systems  Neurological: Positive for headaches.  Psychiatric/Behavioral: Positive for suicidal ideas.  All other systems reviewed and are negative.    Allergies  Morphine  Home Medications   Current Outpatient Rx  Name Route Sig Dispense Refill  . ALBUTEROL 90 MCG/ACT IN AERS Inhalation Inhale 2 puffs into the lungs every 6 (six) hours as needed. 17 g 6  . CLONAZEPAM 1 MG PO TABS Oral Take 1 mg by mouth 2 (two) times daily as needed. For anxiety.    Marland Kitchen NITROGLYCERIN 0.4 MG SL SUBL Sublingual Place 1 tablet (0.4 mg total) under the tongue every 5 (five) minutes as needed for chest pain. 100 tablet 3  . QUETIAPINE FUMARATE 200 MG PO TABS Oral Take 600 mg by mouth at bedtime.      . SUMATRIPTAN SUCCINATE 100 MG PO TABS Oral Take 1 tablet (100 mg total) by mouth every 2 (two) hours as needed for migraine. Take 1 pill when you feel migraines beginning.  If no relief in 2 hours, you make take another pill.  Do not take more than 2 pills daily. 10 tablet 3  . TOPIRAMATE 50 MG PO TABS Oral Take 1 tablet (50 mg total) by mouth every evening. To prevent migrains 30 tablet 6    BP 143/103  Pulse 95  Temp(Src) 98 F (36.7  C) (Oral)  Resp 18  SpO2 100%  Physical Exam  Vitals reviewed. Constitutional: She is oriented to person, place, and time. Vital signs are normal. She appears well-developed and well-nourished. No distress.  HENT:  Head: Normocephalic and atraumatic.  Mouth/Throat: Oropharynx is clear and moist.  Eyes: Conjunctivae and EOM are normal. Pupils are equal, round, and reactive to light.  Neck: Normal range of motion. Neck supple. No spinous process tenderness and no muscular tenderness present. No rigidity. No Brudzinski's sign and no Kernig's sign noted.  Pulmonary/Chest: Effort normal.  Lymphadenopathy:    She has no cervical adenopathy.  Neurological: She is alert  and oriented to person, place, and time. No cranial nerve deficit. She exhibits normal muscle tone. Coordination normal.  Skin: Skin is warm and dry. No rash noted. No erythema. No pallor.  Psychiatric: She has a normal mood and affect. Her behavior is normal. Thought content normal.    ED Course  Procedures   Results for orders placed during the hospital encounter of 09/25/11  CBC      Component Value Range   WBC 6.6  4.0 - 10.5 (K/uL)   RBC 4.04  3.87 - 5.11 (MIL/uL)   Hemoglobin 13.1  12.0 - 15.0 (g/dL)   HCT 30.8  65.7 - 84.6 (%)   MCV 93.1  78.0 - 100.0 (fL)   MCH 32.4  26.0 - 34.0 (pg)   MCHC 34.8  30.0 - 36.0 (g/dL)   RDW 96.2  95.2 - 84.1 (%)   Platelets 294  150 - 400 (K/uL)  COMPREHENSIVE METABOLIC PANEL      Component Value Range   Sodium 138  135 - 145 (mEq/L)   Potassium 2.9 (*) 3.5 - 5.1 (mEq/L)   Chloride 104  96 - 112 (mEq/L)   CO2 21  19 - 32 (mEq/L)   Glucose, Bld 81  70 - 99 (mg/dL)   BUN 9  6 - 23 (mg/dL)   Creatinine, Ser 3.24  0.50 - 1.10 (mg/dL)   Calcium 9.3  8.4 - 40.1 (mg/dL)   Total Protein 7.2  6.0 - 8.3 (g/dL)   Albumin 4.2  3.5 - 5.2 (g/dL)   AST 15  0 - 37 (U/L)   ALT 7  0 - 35 (U/L)   Alkaline Phosphatase 63  39 - 117 (U/L)   Total Bilirubin 0.4  0.3 - 1.2 (mg/dL)   GFR calc non Af Amer 77 (*) >90 (mL/min)   GFR calc Af Amer 89 (*) >90 (mL/min)  ACETAMINOPHEN LEVEL      Component Value Range   Acetaminophen (Tylenol), Serum <15.0  10 - 30 (ug/mL)  PREGNANCY, URINE      Component Value Range   Preg Test, Ur NEGATIVE  NEGATIVE   POCT PREGNANCY, URINE      Component Value Range   Preg Test, Ur NEGATIVE  NEGATIVE    No results found.   MDM   5:25 PM Patient discussed with Dr. Brooke Dare. Will consult [facility] and [facility]. I have written Holding orders for the patient  Spoke with Clydie Braun from [facility]. She will come see the pt for evaluation.      Thomasene Lot, PA-C 09/25/11 1727

## 2011-09-25 NOTE — ED Notes (Signed)
Has had surgery on her back, was told by her MD that she may end up in a WC and not be able to walk, last nite she drank 3 1/5s of liquor, took Klonopin, Seroquel and used cocaine that she has been off of x7 years, states she is also bipolar and has depression

## 2011-09-25 NOTE — ED Notes (Signed)
Care assumed

## 2011-09-26 MED ORDER — POTASSIUM CHLORIDE CRYS ER 20 MEQ PO TBCR
40.0000 meq | EXTENDED_RELEASE_TABLET | Freq: Once | ORAL | Status: AC
  Administered 2011-09-26: 40 meq via ORAL
  Filled 2011-09-26: qty 2

## 2011-09-26 NOTE — ED Notes (Signed)
telepsych consult request with pt chart faxed.

## 2011-09-26 NOTE — ED Notes (Signed)
Patient requesting something to eat at this time. Explained lunch tray would be here soon. Patient states she's hungry at this time. Provided ham sandwich, pack of cookies and sprite for pt.

## 2011-09-26 NOTE — ED Notes (Signed)
Patient declining offer for shower at this time.

## 2011-09-26 NOTE — ED Provider Notes (Signed)
  Physical Exam  BP 129/89  Pulse 89  Temp(Src) 98 F (36.7 C) (Oral)  Resp 18  SpO2 99%  Physical Exam  ED Course  Procedures  MDM Virals normal.  K+ is low at 2.9, oral replacement ordered.  Was normal at last check.  No distress and no events overnight.  Pt is mostly wheelchair bound, has SI without specific plan reportedly.  Has been denied at [facility] yesterday, [facility] continues to look for placement elsewhere.        Gavin Pound. Oletta Lamas, MD 09/26/11 [phone]

## 2011-09-27 MED ORDER — METOCLOPRAMIDE HCL 5 MG/ML IJ SOLN
10.0000 mg | Freq: Once | INTRAMUSCULAR | Status: AC
  Administered 2011-09-27: 10 mg via INTRAMUSCULAR
  Filled 2011-09-27: qty 2

## 2011-09-27 MED ORDER — DIPHENHYDRAMINE HCL 50 MG/ML IJ SOLN
50.0000 mg | Freq: Once | INTRAMUSCULAR | Status: AC
  Administered 2011-09-27: 50 mg via INTRAMUSCULAR
  Filled 2011-09-27: qty 1

## 2011-09-27 NOTE — ED Notes (Signed)
TC from [facility]. Pt declined by Dr. Toniann Fail due to pt acuity.

## 2011-09-27 NOTE — ED Notes (Signed)
Pt requesting more medication for headache. rn offered prn because pt reported she is still hearing voices, but they are quieter and muffled today, reports also still having nightmares.

## 2011-09-27 NOTE — ED Provider Notes (Signed)
Pt seen and evaluated in the psych ED.  Pt c/o frontal headache.  Pt has been decline at [facility], awaiting placement elsewhere.  She is here voluntariliy  Ethelda Chick, MD 09/27/11 1050

## 2011-09-27 NOTE — ED Notes (Signed)
Pt information faxed to [facility].

## 2011-09-27 NOTE — ED Notes (Signed)
Called Tonya at [facility] for admission referral, pt was determined to be out of network.

## 2011-09-27 NOTE — ED Notes (Signed)
Pt still reports pain from migraine. md alerted and will ordered more medications.

## 2011-09-27 NOTE — ED Notes (Signed)
Pt alert and oriented x4. Respirations even and unlabored, bilateral symmetrical rise and fall of chest. Skin warm and dry. In no acute distress. Denies needs.   
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
## 2011-09-28 MED ORDER — DIPHENHYDRAMINE HCL 50 MG/ML IJ SOLN
50.0000 mg | Freq: Once | INTRAMUSCULAR | Status: AC
  Administered 2011-09-28: 50 mg via INTRAMUSCULAR
  Filled 2011-09-28: qty 1

## 2011-09-28 MED ORDER — METOCLOPRAMIDE HCL 5 MG/ML IJ SOLN
10.0000 mg | Freq: Once | INTRAMUSCULAR | Status: AC
  Administered 2011-09-28: 10 mg via INTRAMUSCULAR
  Filled 2011-09-28 (×2): qty 2

## 2011-09-28 NOTE — ED Notes (Signed)
Resting quietly, husband at bedside.  Pt reports migraine.  Pt reports ha is typical of her migraines and that yesterday the shots that she was given worked better that the pills.

## 2011-09-28 NOTE — ED Notes (Signed)
Snack and soda given

## 2011-09-28 NOTE — ED Notes (Signed)
Patients husband into see 

## 2011-09-28 NOTE — ED Notes (Signed)
Up to the bathroom 

## 2011-09-28 NOTE — ED Notes (Signed)
ACT into see 

## 2011-09-28 NOTE — ED Notes (Signed)
Belongings returned,  Pt dc'd home w/ written  Dc instructions.   Pt encouraged to return for return/worsening of voices/thoughts of harming herself or others.  Pt verbalized understanding.

## 2011-09-28 NOTE — ED Provider Notes (Addendum)
  Physical Exam  BP 113/76  Pulse 76  Temp(Src) 98.5 F (36.9 C) (Oral)  Resp 16  SpO2 100%  Physical Exam  ED Course  Procedures  MDM Patient's had another headache. We will treat with Reglan and Benadryl again. It helped yesterday.      Juliet Rude. Rubin Payor, MD 09/28/11 1406  Patient  was seen by asked. He recommends discharge. dosages reviewed and they also requested discharge. Patient's hallucinations are decreased. She's not a risk for self at this time. She'll be discharged home to followup as needed   Juliet Rude. Rubin Payor, MD 09/28/11 1537

## 2011-09-28 NOTE — BHH Counselor (Signed)
Pt had a Tele Psych and MD recommended discharge to home with follow up.  Pt has been in therapy with Doralee Albino at [facility] and has apt on Tuesday.  Pt plans to keep apt.  Pt fiance was present and he verbalized agreement with dispo.  Pt contracted for safety and fiance witnessed signature.  Pt fiance will be present with pt throughout weekend to insure support.  Pt was able to verbalize she has struggled with hearing voices and being depressed since she was 47 yrs of age.  Pt reported she is not hearing any commands to hurt self and has no desire to hurt self or others.  Pt was Ox4, good eye contact, was able to identify triggers and we discussed coping skills to implement to insure safety plan was in place.  Dr. Rubin Payor reviewed Tele Psych and was in agreement with dispo.

## 2011-10-09 ENCOUNTER — Telehealth: Payer: Self-pay | Admitting: Family Medicine

## 2011-10-09 NOTE — Telephone Encounter (Signed)
Patient dropped off form to be signed for counseling,  Placed in dr's box.  Please fax when completed.

## 2011-10-10 NOTE — Telephone Encounter (Signed)
Signed and placed in "To be faxed" bin.

## 2011-10-17 ENCOUNTER — Encounter: Payer: Self-pay | Admitting: Family Medicine

## 2011-10-17 ENCOUNTER — Ambulatory Visit (INDEPENDENT_AMBULATORY_CARE_PROVIDER_SITE_OTHER): Payer: Medicaid Other | Admitting: Family Medicine

## 2011-10-17 VITALS — BP 128/89 | HR 96 | Temp 97.5°F | Ht 65.5 in | Wt 99.0 lb

## 2011-10-17 DIAGNOSIS — G43909 Migraine, unspecified, not intractable, without status migrainosus: Secondary | ICD-10-CM

## 2011-10-17 MED ORDER — KETOROLAC TROMETHAMINE 30 MG/ML IJ SOLN
30.0000 mg | Freq: Once | INTRAMUSCULAR | Status: AC
  Administered 2011-10-17: 15 mg via INTRAMUSCULAR

## 2011-10-17 MED ORDER — TOPIRAMATE 50 MG PO TABS: 50.0000 mg | ORAL_TABLET | Freq: Two times a day (BID) | ORAL | Status: DC

## 2011-10-17 MED ORDER — PROMETHAZINE HCL 25 MG/ML IJ SOLN
12.5000 mg | Freq: Once | INTRAMUSCULAR | Status: AC
  Administered 2011-10-17: 12.5 mg via INTRAMUSCULAR

## 2011-10-17 MED ORDER — DIPHENHYDRAMINE HCL 50 MG/ML IJ SOLN
50.0000 mg | Freq: Once | INTRAMUSCULAR | Status: AC
  Administered 2011-10-17: 12.5 mg via INTRAMUSCULAR

## 2011-10-17 NOTE — Progress Notes (Signed)
Addended by: Jone Baseman D on: 10/17/2011 03:13 PM   Modules accepted: Orders

## 2011-10-17 NOTE — Assessment & Plan Note (Signed)
Exacerbation. Toradol 15, phenergan 12.5, Benadryl 12.5 today.  Patient presented to [facility] 02/06 for migraines alleviated by Reglan and Benadryl.  Topiramate not doing as good of a job now at preventing headaches; has had 15/30 days this past month. Will increase to from 50 qd to bid.  Imitrex not helping as much. Recommending Tylenol/NSAIDs for breakthrough.   At ED visit 02/06, patient having hallucinations and suicidal ideation. Her back pain seemed severe at that time, and patient expressed concerns about being wheelchair-bound. Her back pain is improved today; she is getting injections through neurosurgeon.  I suspect her psychiatric disorder/stress/chronic pain contributing to headaches. Will follow-up in 1-2 weeks to follow-up on headaches and re-assess mental health.

## 2011-10-17 NOTE — Patient Instructions (Addendum)
Follow-up in 1-2 weeks regarding your headaches. Let's also talk more about the stressors in your life at that time.   Try ibuprofen 600 mg up to every 6 hours as needed for your headaches if the Imitrex is not helping.

## 2011-10-17 NOTE — Progress Notes (Signed)
  Subjective:    Patient ID: Arlana Pouch, female    DOB: 07/26/1966, 46 y.o.   MRN: [id number]  HPI Acute visit: headache  History of migraines.  Went to [facility] about 1 month ago for severe headaches. Given injections, and headaches resolved.  But having exacerbations on and off for the past month; maybe 15/30 days.  Imitrex and topiramate used to help but no relief with Imitrex currently. Has not tried Tylenol or NSAIDs.   Feels like severe versions of her usual migraines Pain is bitemporal. Sometimes associated with nausea/vomiting. Sometimes with photophobia.   Review of Systems Complaining of seeing floaters Denies gait, movement abnormalities Denies decreased sensation  Denies changes in hearing  Denies SI/HI    Objective:   Physical Exam Gen: mild-moderate distress HEENT: no papilledema Neuro: intact, including CN, sensation, and movement in extremities; gait normal     Assessment & Plan:

## 2011-11-01 ENCOUNTER — Ambulatory Visit: Payer: Self-pay | Admitting: Family Medicine

## 2011-11-17 ENCOUNTER — Encounter: Payer: Self-pay | Admitting: Family Medicine

## 2011-11-17 DIAGNOSIS — M549 Dorsalgia, unspecified: Secondary | ICD-10-CM

## 2011-11-17 NOTE — Assessment & Plan Note (Addendum)
Documentation only. Seen 02/05 by Dr. Jordan Likes. PLAN:    -Bilateral lumbar facet nerve injection done    -Radiofrequency neurolysis of lumbar facets for chronic pain control   -Pennsaid solution, Lidocaine ointment   -Narcotic contract; UDS positive THC/cocaine   -TENS unit   -PT to be discussed   -Follow-up 3-4 weeks

## 2011-12-30 ENCOUNTER — Encounter (HOSPITAL_COMMUNITY): Payer: Self-pay | Admitting: Emergency Medicine

## 2011-12-30 ENCOUNTER — Encounter (HOSPITAL_COMMUNITY): Payer: Self-pay | Admitting: *Deleted

## 2011-12-30 ENCOUNTER — Emergency Department (HOSPITAL_COMMUNITY)
Admission: EM | Admit: 2011-12-30 | Discharge: 2011-12-30 | Disposition: A | Discharge status: Home / Self Care | Payer: Medicaid Other | Source: Home / Self Care | Attending: Emergency Medicine | Admitting: Emergency Medicine

## 2011-12-30 ENCOUNTER — Emergency Department (HOSPITAL_COMMUNITY)
Admission: EM | Admit: 2011-12-30 | Discharge: 2011-12-30 | Disposition: A | Discharge status: Home / Self Care | Payer: Medicaid Other | Attending: Emergency Medicine | Admitting: Emergency Medicine

## 2011-12-30 DIAGNOSIS — Z79899 Other long term (current) drug therapy: Secondary | ICD-10-CM | POA: Insufficient documentation

## 2011-12-30 DIAGNOSIS — G43909 Migraine, unspecified, not intractable, without status migrainosus: Secondary | ICD-10-CM

## 2011-12-30 DIAGNOSIS — J45909 Unspecified asthma, uncomplicated: Secondary | ICD-10-CM | POA: Insufficient documentation

## 2011-12-30 DIAGNOSIS — N949 Unspecified condition associated with female genital organs and menstrual cycle: Secondary | ICD-10-CM | POA: Insufficient documentation

## 2011-12-30 DIAGNOSIS — IMO0002 Reserved for concepts with insufficient information to code with codable children: Secondary | ICD-10-CM | POA: Insufficient documentation

## 2011-12-30 DIAGNOSIS — R000 Tachycardia, unspecified: Secondary | ICD-10-CM | POA: Insufficient documentation

## 2011-12-30 DIAGNOSIS — N760 Acute vaginitis: Secondary | ICD-10-CM | POA: Insufficient documentation

## 2011-12-30 DIAGNOSIS — R64 Cachexia: Secondary | ICD-10-CM | POA: Insufficient documentation

## 2011-12-30 DIAGNOSIS — R110 Nausea: Secondary | ICD-10-CM | POA: Insufficient documentation

## 2011-12-30 DIAGNOSIS — R1110 Vomiting, unspecified: Secondary | ICD-10-CM | POA: Insufficient documentation

## 2011-12-30 DIAGNOSIS — M542 Cervicalgia: Secondary | ICD-10-CM | POA: Insufficient documentation

## 2011-12-30 DIAGNOSIS — H53149 Visual discomfort, unspecified: Secondary | ICD-10-CM | POA: Insufficient documentation

## 2011-12-30 HISTORY — DX: Eating disorder, unspecified: F50.9

## 2011-12-30 HISTORY — DX: Migraine, unspecified, not intractable, without status migrainosus: G43.909

## 2011-12-30 LAB — URINALYSIS, ROUTINE W REFLEX MICROSCOPIC
Leukocytes, UA: NEGATIVE
Nitrite: NEGATIVE
Specific Gravity, Urine: 1.019 (ref 1.005–1.030)
Urobilinogen, UA: 0.2 mg/dL (ref 0.0–1.0)
pH: 6 (ref 5.0–8.0)

## 2011-12-30 LAB — WET PREP, GENITAL

## 2011-12-30 LAB — GC/CHLAMYDIA PROBE AMP, GENITAL
Chlamydia, DNA Probe: NEGATIVE
GC Probe Amp, Genital: NEGATIVE

## 2011-12-30 MED ORDER — DIPHENHYDRAMINE HCL 25 MG PO CAPS
25.0000 mg | ORAL_CAPSULE | Freq: Once | ORAL | Status: AC
  Administered 2011-12-30: 25 mg via ORAL
  Filled 2011-12-30: qty 1

## 2011-12-30 MED ORDER — DIPHENHYDRAMINE HCL 50 MG/ML IJ SOLN
25.0000 mg | Freq: Once | INTRAMUSCULAR | Status: AC
  Administered 2011-12-30: 25 mg via INTRAVENOUS
  Filled 2011-12-30: qty 1

## 2011-12-30 MED ORDER — PROMETHAZINE HCL 25 MG/ML IJ SOLN
25.0000 mg | Freq: Once | INTRAMUSCULAR | Status: AC
  Administered 2011-12-30: 25 mg via INTRAVENOUS
  Filled 2011-12-30: qty 1

## 2011-12-30 MED ORDER — SODIUM CHLORIDE 0.9 % IV SOLN
INTRAVENOUS | Status: DC
  Administered 2011-12-30: 18:00:00 via INTRAVENOUS

## 2011-12-30 MED ORDER — KETOROLAC TROMETHAMINE 30 MG/ML IJ SOLN
30.0000 mg | Freq: Once | INTRAMUSCULAR | Status: AC
  Administered 2011-12-30: 30 mg via INTRAVENOUS
  Filled 2011-12-30: qty 1

## 2011-12-30 MED ORDER — KETOROLAC TROMETHAMINE 30 MG/ML IJ SOLN
30.0000 mg | Freq: Once | INTRAMUSCULAR | Status: AC
Start: 2011-12-30 — End: 2011-12-30
  Administered 2011-12-30: 30 mg via INTRAMUSCULAR
  Filled 2011-12-30: qty 1

## 2011-12-30 MED ORDER — PROMETHAZINE HCL 25 MG/ML IJ SOLN
12.5000 mg | Freq: Four times a day (QID) | INTRAMUSCULAR | Status: DC | PRN
  Administered 2011-12-30: 12.5 mg via INTRAMUSCULAR
  Filled 2011-12-30: qty 1

## 2011-12-30 MED ORDER — FLUCONAZOLE 150 MG PO TABS: 150.0000 mg | ORAL_TABLET | Freq: Once | ORAL | Status: AC

## 2011-12-30 MED ORDER — HYDROCODONE-ACETAMINOPHEN 5-325 MG PO TABS: 2.0000 | ORAL_TABLET | ORAL | Status: AC | PRN

## 2011-12-30 MED ORDER — PROMETHAZINE HCL 25 MG PO TABS: 25.0000 mg | ORAL_TABLET | Freq: Four times a day (QID) | ORAL | Status: DC | PRN

## 2011-12-30 MED ORDER — HEPARIN BOLUS VIA INFUSION: 4000.0000 [IU] | Freq: Once | INTRAVENOUS | Status: DC

## 2011-12-30 MED ORDER — HYDROMORPHONE HCL PF 1 MG/ML IJ SOLN
1.0000 mg | Freq: Once | INTRAMUSCULAR | Status: AC
  Administered 2011-12-30: 1 mg via INTRAVENOUS
  Filled 2011-12-30: qty 1

## 2011-12-30 NOTE — Discharge Instructions (Signed)
Read the information below.  Please follow up with your primary care provider.  Return to the ER immediately if you develop fevers, abdominal pain, vomiting, or are unable to tolerate fluids by mouth.  You may return to the ER at any time for worsening condition or any new symptoms that concern you.   Migraine Headache A migraine is very bad pain on one or both sides of your head. The cause of a migraine is not always known. A migraine can be triggered or caused by different things, such as:  Alcohol.   Smoking.   Stress.   Periods (menstruation) in women.   Aged cheeses.   Foods or drinks that contain nitrates, glutamate, aspartame, or tyramine.   Lack of sleep.   Chocolate.   Caffeine.   Hunger.   Medicines, such as nitroglycerine (used to treat chest pain), birth control pills, estrogen, and some blood pressure medicines.  HOME CARE  Many medicines can help migraine pain or keep migraines from coming back. Your doctor can help you decide on a medicine or treatment program.   If you or your child gets a migraine, it may help to lie down in a dark, quiet room.   Keep a headache journal. This may help find out what is causing the headaches. For example, write down:   What you eat and drink.   How much sleep you get.   Any change to your diet or medicines.  GET HELP RIGHT AWAY IF:   The medicine does not work.   The pain begins again.   The neck is stiff.   You have trouble seeing.   The muscles are weak or you lose muscle control.   You have new symptoms.   You lose your balance.   You have trouble walking.   You feel faint or pass out.  MAKE SURE YOU:   Understand these instructions.   Will watch this condition.   Will get help right away if you are not doing well or get worse.  Document Released: 05/14/2008 Document Revised: 07/25/2011 Document Reviewed: 04/10/2009 [facility] Patient Information 2012 [place], [place].  Vaginitis Vaginitis is when  the vagina is sore, puffy (swollen), and red (inflamed). HOME CARE  Take your medicine as told. Finish them even if you start to feel better.   Do not have sex (intercourse) until treatment is done or as told by your doctor.   Take warm baths or sit in warm water (sitz bath).   Do not douche.   Do not use tampons, especially scented ones.   Wear cotton underwear.   Do not wear tight pants or pantyhose.   Tell your sex partner that you have a yeast infection. Your partner should see a doctor if symptoms appear, such as a mild rash or itching.   Your sex partner should be treated if your infection is hard to get rid of.   Use a cream to stop the itching. Make sure it is approved by your doctor.  GET HELP RIGHT AWAY IF:   The infection does not go away with treatment.   You have a temperature by mouth above 102 F (38.9 C).   You have belly (abdominal) pain.   You have bleeding from the vagina.  MAKE SURE YOU:   Understand these instructions.   Will watch this condition.   Will get help right away if you are not doing well or get worse.  Document Released: 11/01/2008 Document Revised: 07/25/2011 Document Reviewed: 11/01/2008  [employer] Patient Information 2012 [place].

## 2011-12-30 NOTE — ED Provider Notes (Signed)
History     CSN: [id number]  Arrival date & time 12/30/11  1549   First MD Initiated Contact with Patient 12/30/11 1752      Chief Complaint  Patient presents with  . Headache  . Torticollis    (Consider location/radiation/quality/duration/timing/severity/associated sxs/prior treatment) Patient is a 46 y.o. female presenting with headaches. The history is provided by the patient, the spouse and medical records.  Headache  Associated symptoms include nausea. Pertinent negatives include no fever and no vomiting.   46, old female, with a history of migraine headaches, who presents to emergency department complaining of a migraine for the past 6 days.  She has photophobia and nausea.  She denies fever, or rash.  She was treated earlier today with Phenergan and Toradol and Benadryl for her migraine.  She had improved, but now.  She has returned with recurrent migraine.  Past Medical History  Diagnosis Date  . Asthma   . Lumbar radiculopathy, chronic   . Migraines   . Eating disorder     History reviewed. No pertinent past surgical history.  Family History  Problem Relation Age of Onset  . Cancer Other     History  Substance Use Topics  . Smoking status: Current Everyday Smoker -- 1.0 packs/day  . Smokeless tobacco: Not on file   Comment: is trying to quitt  . Alcohol Use: Yes    OB History    Grav Para Term Preterm Abortions TAB SAB Ect Mult Living                  Review of Systems  Constitutional: Negative for fever.  HENT: Positive for neck pain.   Eyes: Positive for photophobia.  Gastrointestinal: Positive for nausea. Negative for vomiting.  Neurological: Positive for headaches. Negative for weakness.  Psychiatric/Behavioral: Negative for confusion.  All other systems reviewed and are negative.    Allergies  Morphine  Home Medications   Current Outpatient Rx  Name Route Sig Dispense Refill  . CLONAZEPAM 1 MG PO TABS Oral Take 1 mg by mouth 2 (two)  times daily as needed. For anxiety.    . IBUPROFEN 400 MG PO TABS Oral Take 400 mg by mouth every 6 (six) hours as needed. For pain relief    . NITROGLYCERIN 0.4 MG SL SUBL Sublingual Place 1 tablet (0.4 mg total) under the tongue every 5 (five) minutes as needed for chest pain. 100 tablet 3  . QUETIAPINE FUMARATE 300 MG PO TABS Oral Take 600 mg by mouth at bedtime.    . TOPIRAMATE 50 MG PO TABS Oral Take 1 tablet (50 mg total) by mouth 2 (two) times daily. To prevent migrains 60 tablet 1  . FLUCONAZOLE 150 MG PO TABS Oral Take 1 tablet (150 mg total) by mouth once. 1 tablet 0    BP 139/106  Pulse 137  Temp(Src) 98.4 F (36.9 C) (Oral)  Resp 16  SpO2 100%  LMP 11/03/2011  Physical Exam  Vitals reviewed. Constitutional: She is oriented to person, place, and time. No distress.       Cachectic female, in no distress  HENT:  Head: Normocephalic and atraumatic.  Eyes: Conjunctivae and EOM are normal. Pupils are equal, round, and reactive to light.  Neck: Normal range of motion. Neck supple.       No nuchal rigidity  Cardiovascular:  No murmur heard.      Tachycardia with heart rate 101 on the monitor  Pulmonary/Chest: Effort normal. No respiratory distress.  Abdominal: Soft. She exhibits no distension.  Musculoskeletal: Normal range of motion. She exhibits no edema.  Neurological: She is alert and oriented to person, place, and time. No cranial nerve deficit. Coordination normal.  Skin: Skin is warm and dry. No rash noted.  Psychiatric: She has a normal mood and affect. Thought content normal.    ED Course  Procedures (including critical care time) Migraine headache.  No indication for testing   Labs Reviewed  GLUCOSE, CAPILLARY   No results found.   No diagnosis found.    MDM  Migraine headache        Cheri Guppy, MD 12/30/11 2206

## 2011-12-30 NOTE — ED Provider Notes (Signed)
History     CSN: [id number]  Arrival date & time 12/30/11  0509   First MD Initiated Contact with Patient 12/30/11 0600      Chief Complaint  Patient presents with  . Vaginal Pain    (Consider location/radiation/quality/duration/timing/severity/associated sxs/prior treatment) HPI Comments: The patient reports burning and itching of the vagina x 5 days.  LMP March 10, s/p tubal ligation, has not taken pregnancy test.  States periods are usually  regular, occuring monthly.  Pt vomited once two nights ago, contents of her stomach.  Denies any abnormal vaginal discharge or bleeding.  Denies using any new chemicals, soaps, condoms, etc within vagina.  Denies fevers, abdominal pain, change in bowel habits.   Patient also notes migraine headache that is typical of her chronic headaches. This is day #5 of her headache.  States the pain is all over her head, she is sensitive to light and has nausea.  Denies fevers, head trauma, and change from her typical headaches.    Patient is a 46 y.o. female presenting with vaginal pain. The history is provided by the patient.  Vaginal Pain Associated symptoms include vomiting. Pertinent negatives include no abdominal pain, chills or fever.    Past Medical History  Diagnosis Date  . Asthma   . Lumbar radiculopathy, chronic     History reviewed. No pertinent past surgical history.  Family History  Problem Relation Age of Onset  . Cancer Other     History  Substance Use Topics  . Smoking status: Current Everyday Smoker -- 1.0 packs/day  . Smokeless tobacco: Not on file   Comment: is trying to quitt  . Alcohol Use: Yes    OB History    Grav Para Term Preterm Abortions TAB SAB Ect Mult Living                  Review of Systems  Constitutional: Negative for fever and chills.  Gastrointestinal: Positive for vomiting. Negative for abdominal pain, diarrhea and constipation.  Genitourinary: Positive for vaginal pain and menstrual problem.  Negative for dysuria, urgency, frequency, vaginal bleeding and vaginal discharge.    Allergies  Morphine  Home Medications   Current Outpatient Rx  Name Route Sig Dispense Refill  . CLONAZEPAM 1 MG PO TABS Oral Take 1 mg by mouth 2 (two) times daily as needed. For anxiety.    . QUETIAPINE FUMARATE 300 MG PO TABS Oral Take 600 mg by mouth at bedtime.    . TOPIRAMATE 50 MG PO TABS Oral Take 1 tablet (50 mg total) by mouth 2 (two) times daily. To prevent migrains 60 tablet 1  . NITROGLYCERIN 0.4 MG SL SUBL Sublingual Place 1 tablet (0.4 mg total) under the tongue every 5 (five) minutes as needed for chest pain. 100 tablet 3    BP 157/109  Pulse 115  Temp(Src) 98.1 F (36.7 C) (Oral)  Resp 16  SpO2 100%  LMP 11/03/2011  Physical Exam  Nursing note and vitals reviewed. Constitutional: She is oriented to person, place, and time. She appears well-developed and well-nourished.  HENT:  Head: Normocephalic and atraumatic.  Neck: Neck supple.  Pulmonary/Chest: Effort normal.  Abdominal: Soft. She exhibits no distension and no mass. There is no tenderness. There is no rebound and no guarding.  Genitourinary: Uterus is not enlarged and not tender. Cervix exhibits no motion tenderness, no discharge and no friability. Right adnexum displays no mass, no tenderness and no fullness. Left adnexum displays no mass, no tenderness and no  fullness. There is tenderness around the vagina. No bleeding around the vagina. No foreign body around the vagina. No vaginal discharge found.       Vagina diffusely tender to palpation, mild erythema.    Neurological: She is alert and oriented to person, place, and time.    ED Course  Procedures (including critical care time)  Labs Reviewed  WET PREP, GENITAL - Abnormal; Notable for the following:    WBC, Wet Prep HPF POC FEW (*)    All other components within normal limits  URINALYSIS, ROUTINE W REFLEX MICROSCOPIC  POCT PREGNANCY, URINE  GC/CHLAMYDIA PROBE  AMP, GENITAL   No results found.   1. Vaginitis   2. Migraine headache       MDM  Patient presented with typical migraine headache which she discussed with me just prior to discharge.  Initial complaint was for vaginal burning and itching - exam shows mildly erythematous tender vagina.  No discharge.  Bimanual unremarkable.  Wet prep clear.  Will give diflucan prescription, PCP follow up.  GC/Chlam pending. Dr Madolyn Frieze at [facility] takes care of patient for gyn needs (per patient).  She also follows her for migraines.  D/C home after Im medications for migraine, PCP follow up.  Return precautions given.  Patient verbalizes understanding and agrees with plan.          Dillard Cannon [place], [place] 12/30/11 [phone]

## 2011-12-30 NOTE — ED Notes (Signed)
MD/PA at bedside. 

## 2011-12-30 NOTE — ED Notes (Signed)
Pt sts she has new onset vaginal pain. No dysuria. Pt sts that it feels slightly like UTI, but with pain in the vaginal canal. No new sexual partners or antibiotics in the past 2 weeks. Pt sts pain started wednesday evening and pt has not attempted any medication. Sts that anything close to her vagina makes the pain worse.  Pt sts that she has lost 13lbs in the past 2 weeks due to limiting her food intake. Sts that she has had migraines the past 5 days.

## 2011-12-30 NOTE — ED Notes (Signed)
Pt c/o vaginal burning, denies discharge and itching.

## 2011-12-30 NOTE — ED Notes (Addendum)
Pts husbands requests that pt have CT of head performed. States "it's something going on up there, she just gets upset for no reason."

## 2011-12-30 NOTE — ED Provider Notes (Signed)
Medical screening examination/treatment/procedure(s) were performed by non-physician practitioner and as supervising physician I was immediately available for consultation/collaboration.  Connie Restivo M Breahna Boylen, MD 12/30/11 0807 

## 2011-12-30 NOTE — Discharge Instructions (Signed)
Use vicodin and phenergan for recurrent headaches.  Follow up with your doctor for reevaluation.

## 2011-12-30 NOTE — ED Notes (Addendum)
To ed via [facility] 31 with c/o no improvement from this morning's visit. Continued h/a,  Patient states "day 6 for migraine"

## 2011-12-31 ENCOUNTER — Encounter (HOSPITAL_COMMUNITY): Payer: Self-pay | Admitting: *Deleted

## 2011-12-31 ENCOUNTER — Emergency Department (HOSPITAL_COMMUNITY)
Admission: EM | Admit: 2011-12-31 | Discharge: 2011-12-31 | Disposition: A | Discharge status: Home / Self Care | Payer: Medicaid Other | Attending: Emergency Medicine | Admitting: Emergency Medicine

## 2011-12-31 DIAGNOSIS — R51 Headache: Secondary | ICD-10-CM

## 2011-12-31 DIAGNOSIS — Z79899 Other long term (current) drug therapy: Secondary | ICD-10-CM | POA: Insufficient documentation

## 2011-12-31 DIAGNOSIS — J45909 Unspecified asthma, uncomplicated: Secondary | ICD-10-CM | POA: Insufficient documentation

## 2011-12-31 MED ORDER — DEXAMETHASONE SODIUM PHOSPHATE 10 MG/ML IJ SOLN
10.0000 mg | Freq: Once | INTRAMUSCULAR | Status: AC
  Administered 2011-12-31: 10 mg via INTRAVENOUS
  Filled 2011-12-31: qty 1

## 2011-12-31 MED ORDER — METOCLOPRAMIDE HCL 5 MG/ML IJ SOLN
10.0000 mg | Freq: Once | INTRAMUSCULAR | Status: AC
  Administered 2011-12-31: 10 mg via INTRAVENOUS
  Filled 2011-12-31: qty 2

## 2011-12-31 MED ORDER — SODIUM CHLORIDE 0.9 % IV BOLUS (SEPSIS): 1000.0000 mL | Freq: Once | INTRAVENOUS | Status: DC

## 2011-12-31 MED ORDER — NAPROXEN 500 MG PO TABS: 500.0000 mg | ORAL_TABLET | Freq: Two times a day (BID) | ORAL | Status: DC

## 2011-12-31 MED ORDER — HYDROMORPHONE HCL PF 1 MG/ML IJ SOLN
1.0000 mg | Freq: Once | INTRAMUSCULAR | Status: AC
  Administered 2011-12-31: 1 mg via INTRAVENOUS
  Filled 2011-12-31: qty 1

## 2011-12-31 MED ORDER — DIAZEPAM 5 MG PO TABS
5.0000 mg | ORAL_TABLET | Freq: Once | ORAL | Status: AC
  Administered 2011-12-31: 5 mg via ORAL
  Filled 2011-12-31: qty 1

## 2011-12-31 NOTE — ED Provider Notes (Signed)
History     CSN: [id number]  Arrival date & time 12/31/11  0413   First MD Initiated Contact with Patient 12/31/11 0430      Chief Complaint  Patient presents with  . Migraine    (Consider location/radiation/quality/duration/timing/severity/associated sxs/prior treatment) HPI Patient presents emergency department with continued headache that has been ongoing for the last 8 days.  Patient states she was seen at [facility] yesterday.  She states that she was given medications did not seem to help her headache at all.  She states that she has cluster migraine and her doctors have a hard time managing the with the medication that she is prescribed.  Patient denies chest pain, shortness of breath, diarrhea, abdominal pain, dizziness, or fever.  States that she does have some pain that radiates into her neck. Past Medical History  Diagnosis Date  . Asthma   . Lumbar radiculopathy, chronic   . Migraines   . Eating disorder     History reviewed. No pertinent past surgical history.  Family History  Problem Relation Age of Onset  . Cancer Other     History  Substance Use Topics  . Smoking status: Current Everyday Smoker -- 1.0 packs/day  . Smokeless tobacco: Not on file   Comment: is trying to quitt  . Alcohol Use: Yes    OB History    Grav Para Term Preterm Abortions TAB SAB Ect Mult Living                  Review of Systems All other systems negative except as documented in the HPI. All pertinent positives and negatives as reviewed in the HPI.  Allergies  Morphine  Home Medications   Current Outpatient Rx  Name Route Sig Dispense Refill  . CLONAZEPAM 1 MG PO TABS Oral Take 1 mg by mouth 2 (two) times daily as needed. For anxiety.    . IBUPROFEN 400 MG PO TABS Oral Take 400 mg by mouth every 6 (six) hours as needed. For pain relief    . NITROGLYCERIN 0.4 MG SL SUBL Sublingual Place 1 tablet (0.4 mg total) under the tongue every 5 (five) minutes as needed for  chest pain. 100 tablet 3  . QUETIAPINE FUMARATE 300 MG PO TABS Oral Take 600 mg by mouth at bedtime.    . TOPIRAMATE 50 MG PO TABS Oral Take 1 tablet (50 mg total) by mouth 2 (two) times daily. To prevent migrains 60 tablet 1  . FLUCONAZOLE 150 MG PO TABS Oral Take 1 tablet (150 mg total) by mouth once. 1 tablet 0  . HYDROCODONE-ACETAMINOPHEN 5-325 MG PO TABS Oral Take 2 tablets by mouth every 4 (four) hours as needed for pain. 6 tablet 0  . PROMETHAZINE HCL 25 MG PO TABS Oral Take 1 tablet (25 mg total) by mouth every 6 (six) hours as needed for nausea. 12 tablet 0    BP 147/90  Pulse 54  Temp(Src) 98.1 F (36.7 C) (Oral)  Resp 18  SpO2 100%  LMP 11/03/2011  Physical Exam Physical Examination: General appearance - alert, well appearing, and in no distress, oriented to person, place, and time and normal appearing weight Mental status - alert, oriented to person, place, and time, normal mood, behavior, speech, dress, motor activity, and thought processes Eyes - pupils equal and reactive, extraocular eye movements intact Ears - bilateral TM's and external ear canals normal Nose - normal and patent, no erythema, discharge or polyps Mouth - mucous membranes moist,  pharynx normal without lesions Neck - supple, no significant adenopathy Chest - clear to auscultation, no wheezes, rales or rhonchi, symmetric air entry Heart - normal rate, regular rhythm, normal S1, S2, no murmurs, rubs, clicks or gallops Neurological - alert, oriented, normal speech, no focal findings or movement disorder noted, neck supple without rigidity, DTR's normal and symmetric, motor and sensory grossly normal bilaterally, normal muscle tone, no tremors, strength 5/5  ED Course  Procedures (including critical care time)  Patient be given treatment for her refractory headache, here in the emergency department.  Patient is neurologically intact and there is no obvious signs of deficits on exam at this time.  At this time  I do not feel CT scans are warranted based on her exam.  MDM          Carlyle Dolly, PA-C 12/31/11 1610

## 2011-12-31 NOTE — ED Provider Notes (Signed)
Medical screening examination/treatment/procedure(s) were conducted as a shared visit with non-physician practitioner(s) and myself.  I personally evaluated the patient during the encounter  6:22 AM The patient feels much better after treatment of her headache in the emergency department.  She'll followup with her neurologist.  She is well appearing.  Her neuro exam is normal.  1. Headache      Lyanne Co, MD 12/31/11 [phone]

## 2011-12-31 NOTE — ED Notes (Signed)
Pt reports migraine headache x8 days seen at [facility] 1 day ago for same. Reports syncopal episode with tonight and has had blurred vision with episode of loss vision tonight. Vision currently blurred but able to see

## 2011-12-31 NOTE — Discharge Instructions (Signed)

## 2011-12-31 NOTE — ED Notes (Signed)
The pt has had a headache for 7 days,  She was seen at [facility] in the  Ed yesterday.  She wants a c-t scan.  She says she has cluster headaches, with nv

## 2012-12-14 ENCOUNTER — Encounter (HOSPITAL_COMMUNITY): Payer: Self-pay | Admitting: *Deleted

## 2012-12-14 ENCOUNTER — Emergency Department (HOSPITAL_COMMUNITY)
Admission: EM | Admit: 2012-12-14 | Discharge: 2012-12-15 | Disposition: A | Discharge status: Home / Self Care | Payer: Medicaid - Out of State | Attending: Emergency Medicine | Admitting: Emergency Medicine

## 2012-12-14 DIAGNOSIS — Z87891 Personal history of nicotine dependence: Secondary | ICD-10-CM | POA: Insufficient documentation

## 2012-12-14 DIAGNOSIS — I10 Essential (primary) hypertension: Secondary | ICD-10-CM | POA: Insufficient documentation

## 2012-12-14 DIAGNOSIS — J45909 Unspecified asthma, uncomplicated: Secondary | ICD-10-CM | POA: Insufficient documentation

## 2012-12-14 DIAGNOSIS — F411 Generalized anxiety disorder: Secondary | ICD-10-CM | POA: Insufficient documentation

## 2012-12-14 DIAGNOSIS — Z8739 Personal history of other diseases of the musculoskeletal system and connective tissue: Secondary | ICD-10-CM | POA: Insufficient documentation

## 2012-12-14 DIAGNOSIS — G43909 Migraine, unspecified, not intractable, without status migrainosus: Secondary | ICD-10-CM

## 2012-12-14 DIAGNOSIS — F319 Bipolar disorder, unspecified: Secondary | ICD-10-CM | POA: Insufficient documentation

## 2012-12-14 DIAGNOSIS — Z79899 Other long term (current) drug therapy: Secondary | ICD-10-CM | POA: Insufficient documentation

## 2012-12-14 HISTORY — DX: Anxiety disorder, unspecified: F41.9

## 2012-12-14 HISTORY — DX: Bipolar disorder, unspecified: F31.9

## 2012-12-14 HISTORY — DX: Essential (primary) hypertension: I10

## 2012-12-14 MED ORDER — DIPHENHYDRAMINE HCL 50 MG/ML IJ SOLN
25.0000 mg | Freq: Once | INTRAMUSCULAR | Status: AC
  Administered 2012-12-15: 25 mg via INTRAVENOUS
  Filled 2012-12-14: qty 1

## 2012-12-14 MED ORDER — METOCLOPRAMIDE HCL 5 MG/ML IJ SOLN
10.0000 mg | Freq: Once | INTRAMUSCULAR | Status: AC
  Administered 2012-12-15: 10 mg via INTRAVENOUS
  Filled 2012-12-14: qty 2

## 2012-12-14 MED ORDER — DEXAMETHASONE SODIUM PHOSPHATE 10 MG/ML IJ SOLN
10.0000 mg | Freq: Once | INTRAMUSCULAR | Status: AC
  Administered 2012-12-15: 10 mg via INTRAVENOUS
  Filled 2012-12-14: qty 1

## 2012-12-14 MED ORDER — SODIUM CHLORIDE 0.9 % IV BOLUS (SEPSIS)
1000.0000 mL | Freq: Once | INTRAVENOUS | Status: AC
  Administered 2012-12-15: 1000 mL via INTRAVENOUS

## 2012-12-14 NOTE — ED Notes (Signed)
Pt to ER via EMS for complaint of migraine x 5 hrs; pt states that she takes Maxalt for her migraine and usually has to take 2 tablets; pt states that she only had 1 tablet of her medication and that it provided no relief; pt states that the headache is a typical migraine for pt.

## 2012-12-15 ENCOUNTER — Emergency Department (HOSPITAL_COMMUNITY)
Admission: EM | Admit: 2012-12-15 | Discharge: 2012-12-15 | Disposition: A | Discharge status: Home / Self Care | Payer: Self-pay | Attending: Emergency Medicine | Admitting: Emergency Medicine

## 2012-12-15 ENCOUNTER — Encounter (HOSPITAL_COMMUNITY): Payer: Self-pay | Admitting: Radiology

## 2012-12-15 ENCOUNTER — Emergency Department (HOSPITAL_COMMUNITY): Payer: Medicaid - Out of State

## 2012-12-15 DIAGNOSIS — H53149 Visual discomfort, unspecified: Secondary | ICD-10-CM | POA: Insufficient documentation

## 2012-12-15 DIAGNOSIS — J45909 Unspecified asthma, uncomplicated: Secondary | ICD-10-CM | POA: Insufficient documentation

## 2012-12-15 DIAGNOSIS — G43909 Migraine, unspecified, not intractable, without status migrainosus: Secondary | ICD-10-CM | POA: Insufficient documentation

## 2012-12-15 DIAGNOSIS — Z79899 Other long term (current) drug therapy: Secondary | ICD-10-CM | POA: Insufficient documentation

## 2012-12-15 DIAGNOSIS — Z90710 Acquired absence of both cervix and uterus: Secondary | ICD-10-CM | POA: Insufficient documentation

## 2012-12-15 DIAGNOSIS — H538 Other visual disturbances: Secondary | ICD-10-CM | POA: Insufficient documentation

## 2012-12-15 DIAGNOSIS — I10 Essential (primary) hypertension: Secondary | ICD-10-CM | POA: Insufficient documentation

## 2012-12-15 DIAGNOSIS — F319 Bipolar disorder, unspecified: Secondary | ICD-10-CM | POA: Insufficient documentation

## 2012-12-15 DIAGNOSIS — R112 Nausea with vomiting, unspecified: Secondary | ICD-10-CM | POA: Insufficient documentation

## 2012-12-15 DIAGNOSIS — F411 Generalized anxiety disorder: Secondary | ICD-10-CM | POA: Insufficient documentation

## 2012-12-15 DIAGNOSIS — Z87891 Personal history of nicotine dependence: Secondary | ICD-10-CM | POA: Insufficient documentation

## 2012-12-15 MED ORDER — RIZATRIPTAN BENZOATE 10 MG PO TABS: 10.0000 mg | ORAL_TABLET | ORAL | Status: DC | PRN

## 2012-12-15 MED ORDER — HYDROMORPHONE HCL PF 1 MG/ML IJ SOLN
1.0000 mg | Freq: Once | INTRAMUSCULAR | Status: AC
  Administered 2012-12-15: 1 mg via INTRAVENOUS
  Filled 2012-12-15: qty 1

## 2012-12-15 MED ORDER — METOCLOPRAMIDE HCL 5 MG/ML IJ SOLN
10.0000 mg | Freq: Once | INTRAMUSCULAR | Status: AC
  Administered 2012-12-15: 10 mg via INTRAVENOUS
  Filled 2012-12-15: qty 2

## 2012-12-15 MED ORDER — KETOROLAC TROMETHAMINE 30 MG/ML IJ SOLN
30.0000 mg | Freq: Once | INTRAMUSCULAR | Status: AC
  Administered 2012-12-15: 30 mg via INTRAVENOUS
  Filled 2012-12-15: qty 1

## 2012-12-15 MED ORDER — HYDROMORPHONE HCL PF 1 MG/ML IJ SOLN
0.5000 mg | Freq: Once | INTRAMUSCULAR | Status: AC
  Administered 2012-12-15: 0.5 mg via INTRAVENOUS
  Filled 2012-12-15: qty 1

## 2012-12-15 MED ORDER — DIPHENHYDRAMINE HCL 50 MG/ML IJ SOLN
25.0000 mg | Freq: Once | INTRAMUSCULAR | Status: AC
  Administered 2012-12-15: 25 mg via INTRAVENOUS
  Filled 2012-12-15: qty 1

## 2012-12-15 NOTE — ED Provider Notes (Signed)
History     CSN: [id number]  Arrival date & time 12/14/12  1925   First MD Initiated Contact with Patient 12/14/12 2235      Chief Complaint  Patient presents with  . Migraine    (Consider location/radiation/quality/duration/timing/severity/associated sxs/prior treatment) HPI  Pt with a past medical history of migraines, bipolar disorder, hypertension, "fluid on her brain" and anxiety, and anorexia  presents to the ED with complaints of severe migraine for 5 hours. She used to live in [place] but recently moved to [place] she is here visiting family and ran out of her Maxalt medication. She said that she had a surgery to removed fluid from her brain over a year ago. She also had a hysterectomy done for fibroids 3 months ago. Her headache is so severe that she is having photophobia. This pain is not different from her normal headache exacerbations but she is supposed to be flying back to [place] soon and is worried about tolerating the altitude with her headache being so severe. She suffers from anorexia and often feels weak and tired- today. nad vss  Past Medical History  Diagnosis Date  . Asthma   . Lumbar radiculopathy, chronic   . Migraines   . Eating disorder   . Bipolar 1 disorder   . Anxiety   . Hypertension     Past Surgical History  Procedure Laterality Date  . Abdominal hysterectomy      Family History  Problem Relation Age of Onset  . Cancer Other     History  Substance Use Topics  . Smoking status: Former Smoker -- 1.00 packs/day    Quit date: 11/16/2012  . Smokeless tobacco: Not on file     Comment: is trying to quitt  . Alcohol Use: Yes    OB History   Grav Para Term Preterm Abortions TAB SAB Ect Mult Living                  Review of Systems  Allergies  Morphine  Home Medications   Current Outpatient Rx  Name  Route  Sig  Dispense  Refill  . amitriptyline (ELAVIL) 25 MG tablet   Oral   Take 25 mg by mouth at bedtime.         . clonazePAM  (KLONOPIN) 1 MG tablet   Oral   Take 1 mg by mouth 2 (two) times daily as needed. For anxiety.         Marland Kitchen QUEtiapine (SEROQUEL) 300 MG tablet   Oral   Take 600 mg by mouth at bedtime.         . rizatriptan (MAXALT) 10 MG tablet   Oral   Take 10 mg by mouth as needed for migraine. May repeat in 2 hours if needed         . rizatriptan (MAXALT) 10 MG tablet   Oral   Take 1 tablet (10 mg total) by mouth as needed for migraine. May repeat in 2 hours if needed   10 tablet   0     BP 168/94  Pulse 68  Temp(Src) 99.1 F (37.3 C) (Oral)  Resp 20  Wt 102 lb (46.267 kg)  BMI 16.71 kg/m2  SpO2 99%  LMP 11/03/2011  Physical Exam  Nursing note and vitals reviewed. Constitutional: She appears well-developed and well-nourished. No distress.  HENT:  Head: Normocephalic and atraumatic.  Eyes: Pupils are equal, round, and reactive to light.  Neck: Normal range of motion. Neck supple.  Cardiovascular:  Normal rate and regular rhythm.   Pulmonary/Chest: Effort normal.  Abdominal: Soft.  Neurological: She is alert.  Neurologic exam:  Speech clear, pupils equal round reactive to light, extraocular movements intact   Normal peripheral visual fields Cranial nerves III through XII normal including no facial droop Follows commands, moves all extremities x4, normal strength to bilateral upper and lower extremities at all major muscle groups including grip Sensation normal to light touch and pinprick Coordination intact, no limb ataxia, finger-nose-finger normal Rapid alternating movements normal No pronator drift Gait normal     Skin: Skin is warm and dry.    ED Course  Procedures (including critical care time)  Labs Reviewed - No data to display Ct Head Wo Contrast  12/15/2012  *RADIOLOGY REPORT*  Clinical Data: Migraine headache.  History of bipolar disorder. Reportedly, head burr hole for hydrocephalus.  CT HEAD WITHOUT CONTRAST  Technique:  Contiguous axial images were  obtained from the base of the skull through the vertex without contrast.  Comparison: None.  Findings: There is no burr hole identified either on CT or scout images.   No mass lesion, mass effect, midline shift, hydrocephalus, hemorrhage.  No territorial ischemia or acute infarction.  The paranasal sinuses are within normal limits.  IMPRESSION: Negative CT head.   Original Report Authenticated By: Andreas Newport, M.D.      1. Migraine       MDM  Normal Head CT without fluid on brain. Pt given Benadryl, Decadron and Reglan. Head improved from "14/10" to a 7/10. Pt given 1mg  Dilaudid and headache improved to 5/10. Repeate neuro exam shows no deficits; VSS  Will refill Maxalt and refer patient back to Neurologist in [place].  Pt has been advised of the symptoms that warrant their return to the ED. Patient has voiced understanding and has agreed to follow-up with the PCP or specialist.         Dorthula Matas, PA-C 12/15/12 0122

## 2012-12-15 NOTE — ED Notes (Addendum)
Pt has a hx of migraines and states that she is being treated for them by neuro and has taken excedrin not helping. Pt was seen yesterday and the medication helped some. Alert x4, pt did have fluid drawn off head last year for this same thing

## 2012-12-15 NOTE — ED Notes (Signed)
[id number]<AV> Expected date:<BR> Expected time:<BR> Means of arrival:<BR> Comments:<BR> Triage 2

## 2012-12-15 NOTE — ED Provider Notes (Signed)
Medical screening examination/treatment/procedure(s) were performed by non-physician practitioner and as supervising physician I was immediately available for consultation/collaboration.   Tabytha Gradillas L Birdell Frasier, MD 12/15/12 0804 

## 2012-12-15 NOTE — ED Provider Notes (Signed)
History     CSN: [id number]  Arrival date & time 12/15/12  1417   First MD Initiated Contact with Patient 12/15/12 1508      Chief Complaint  Patient presents with  . Headache    (Consider location/radiation/quality/duration/timing/severity/associated sxs/prior treatment) The history is provided by the patient and medical records. No language interpreter was used.    Connie Black is a 47 y.o. female  with a hx of migraines, bipolar disorder, hypertension, "fluid on her brain" and anxiety, and anorexia  presents to the Emergency Department complaining of gradual, persistent, progressively worsening headahce onset 5 hours ago.  Pt describes the pain as sharp in the frontal lobe, radiates into her shoulders, rated at a 15/10 and constant.  Pt states her pain and symptoms are similar to her normal migraine headaches.  Pt with associated nausea and vomiting as well as blurry vision bilaterally without diplopia. Pt had a hysterectomy 3 weeks (April 2nd) ago and she is taking premarin estrogen replacement.   Nothing makes it better and light, loud noises makes it worse.  Pt denies fever, chills, neck stiffness, chest pain, abdominal pain, diarrhea, weakness, dizziness, syncope, dysuria, hematuria.  Pt has an appointment with neurology on May 6th and her PCP next week for new BP medication as she has been off her usual medications.     Past Medical History  Diagnosis Date  . Asthma   . Lumbar radiculopathy, chronic   . Migraines   . Eating disorder   . Bipolar 1 disorder   . Anxiety   . Hypertension     Past Surgical History  Procedure Laterality Date  . Abdominal hysterectomy      Family History  Problem Relation Age of Onset  . Cancer Other     History  Substance Use Topics  . Smoking status: Former Smoker -- 1.00 packs/day    Quit date: 11/16/2012  . Smokeless tobacco: Not on file     Comment: is trying to quitt  . Alcohol Use: Yes    OB History   Grav Para Term  Preterm Abortions TAB SAB Ect Mult Living                  Review of Systems  Constitutional: Negative for fever, diaphoresis, appetite change, fatigue and unexpected weight change.  HENT: Negative for mouth sores and neck stiffness.   Eyes: Positive for photophobia. Negative for visual disturbance.  Respiratory: Negative for cough, chest tightness, shortness of breath and wheezing.   Cardiovascular: Negative for chest pain.  Gastrointestinal: Positive for nausea and vomiting. Negative for abdominal pain, diarrhea and constipation.  Endocrine: Negative for polydipsia, polyphagia and polyuria.  Genitourinary: Negative for dysuria, urgency, frequency and hematuria.  Musculoskeletal: Negative for back pain.  Skin: Negative for rash.  Allergic/Immunologic: Negative for immunocompromised state.  Neurological: Positive for headaches. Negative for syncope and light-headedness.  Hematological: Does not bruise/bleed easily.  Psychiatric/Behavioral: Negative for sleep disturbance. The patient is not nervous/anxious.     Allergies  Morphine  Home Medications   Current Outpatient Rx  Name  Route  Sig  Dispense  Refill  . amitriptyline (ELAVIL) 25 MG tablet   Oral   Take 25 mg by mouth at bedtime.         . clonazePAM (KLONOPIN) 1 MG tablet   Oral   Take 1 mg by mouth 3 (three) times daily as needed. For anxiety.         Marland Kitchen estrogens, conjugated, (PREMARIN) 0.625  MG tablet   Oral   Take 0.625 mg by mouth daily.         . QUEtiapine (SEROQUEL) 300 MG tablet   Oral   Take 600 mg by mouth at bedtime.         . rizatriptan (MAXALT) 10 MG tablet   Oral   Take 10 mg by mouth as needed for migraine. May repeat in 2 hours if needed         . rizatriptan (MAXALT) 10 MG tablet   Oral   Take 1 tablet (10 mg total) by mouth as needed for migraine. May repeat in 2 hours if needed   10 tablet   0     BP 167/85  Pulse 59  Temp(Src) 98.8 F (37.1 C) (Oral)  Resp 18  SpO2  100%  LMP 11/03/2011  Physical Exam  Nursing note and vitals reviewed. Constitutional: She is oriented to person, place, and time. She appears well-developed. No distress.  Thin appearing  HENT:  Head: Normocephalic and atraumatic.  Right Ear: Tympanic membrane, external ear and ear canal normal.  Left Ear: Tympanic membrane, external ear and ear canal normal.  Nose: No mucosal edema or rhinorrhea.  Mouth/Throat: Uvula is midline, oropharynx is clear and moist and mucous membranes are normal. Mucous membranes are not dry. No oropharyngeal exudate, posterior oropharyngeal edema, posterior oropharyngeal erythema or tonsillar abscesses.  Eyes: Conjunctivae and EOM are normal. Pupils are equal, round, and reactive to light. No scleral icterus.  Neck: Normal range of motion. Neck supple.  Cardiovascular: Normal rate, regular rhythm and intact distal pulses.   Pulmonary/Chest: Effort normal and breath sounds normal. No respiratory distress. She has no wheezes. She has no rales.  Abdominal: Soft. Bowel sounds are normal. There is no tenderness. There is no rebound and no guarding.  Musculoskeletal: Normal range of motion. She exhibits no edema and no tenderness.  Lymphadenopathy:    She has no cervical adenopathy.  Neurological: She is alert and oriented to person, place, and time. She has normal reflexes. No cranial nerve deficit. She exhibits normal muscle tone. Coordination normal.  Speech is clear and goal oriented, follows commands Cranial nerves III - XII without deficit, no facial droop Normal strength in upper and lower extremities bilaterally, strong and equal grip strength Sensation normal to light and sharp touch Moves extremities without ataxia, coordination intact Normal finger to nose and rapid alternating movements Neg romberg, no pronator drift Normal gait Normal heel-shin and balance   Skin: Skin is warm and dry. No rash noted. She is not diaphoretic.  Psychiatric: She has  a normal mood and affect. Her behavior is normal. Judgment and thought content normal.    ED Course  Procedures (including critical care time)  Labs Reviewed - No data to display Ct Head Wo Contrast  12/15/2012  *RADIOLOGY REPORT*  Clinical Data: Migraine headache.  History of bipolar disorder. Reportedly, head burr hole for hydrocephalus.  CT HEAD WITHOUT CONTRAST  Technique:  Contiguous axial images were obtained from the base of the skull through the vertex without contrast.  Comparison: None.  Findings: There is no burr hole identified either on CT or scout images.   No mass lesion, mass effect, midline shift, hydrocephalus, hemorrhage.  No territorial ischemia or acute infarction.  The paranasal sinuses are within normal limits.  IMPRESSION: Negative CT head.   Original Report Authenticated By: Andreas Newport, M.D.      1. Migraine headache  MDM  Arlana Pouch presents with headache.  Pt HA treated with Toradol, benadryl, Regla and Diluadid with improvement while in ED which she is now rating at a 4/10 down from 15/10.  Presentation is like pts typical HA and non concerning for [facility], ICH, Meningitis, or temporal arteritis. Pt had CT head yesterday to rule out further "fluid on her brain" and was negative.  I personally reviewed the imaging tests from yesterday through PACS system and I do not believe she requires a repeat head CT.  I reviewed available ER/hospitalization records through the EMR.  Pt is afebrile with no focal neuro deficits or nuchal rigidity on initial or repeat exam.  Pt's blurry vision has resolved with headache treatment. Pt is to follow up with neurology and PCP to discuss prophylactic and abortive medication.  She states she was unable to fill the Maxalt Rx due the the price and she will just wait and see the MD there for maintenance medications. Pt verbalizes understanding and is agreeable with plan to dc.         Dahlia Client Domenica Weightman, PA-C 12/15/12 1817

## 2012-12-15 NOTE — Discharge Instructions (Signed)
Botox Cosmetic Injections Botox Cosmetic shots (injections) can soften wrinkles on the face. BEFORE THE PROCEDURE  Do not drink alcohol for 1 week before getting the shots.  Ask your doctor about changing or stopping your medicines. PROCEDURE Your doctor will use a thin needle to put the Botox into your skin or muscles. This takes about 15 minutes. AFTER THE PROCEDURE  It may take 5 to 14 days before your wrinkles start to soften.  If the first shots did not work, you may need more shots.  Wrinkles usually go away for 4 to 6 months. You can get more shots every 4 to 6 months. Document Released: 09/07/2010 Document Revised: 10/28/2011 Document Reviewed: 12/11/2010 [facility] Patient Information 2013 [place], [place].  Migraine Headache A migraine headache is an intense, throbbing pain on one or both sides of your head. A migraine can last for 30 minutes to several hours. CAUSES  The exact cause of a migraine headache is not always known. However, a migraine may be caused when nerves in the brain become irritated and release chemicals that cause inflammation. This causes pain. SYMPTOMS  Pain on one or both sides of your head.  Pulsating or throbbing pain.  Severe pain that prevents daily activities.  Pain that is aggravated by any physical activity.  Nausea, vomiting, or both.  Dizziness.  Pain with exposure to bright lights, loud noises, or activity.  General sensitivity to bright lights, loud noises, or smells. Before you get a migraine, you may get warning signs that a migraine is coming (aura). An aura may include:  Seeing flashing lights.  Seeing bright spots, halos, or zig-zag lines.  Having tunnel vision or blurred vision.  Having feelings of numbness or tingling.  Having trouble talking.  Having muscle weakness. MIGRAINE TRIGGERS  Alcohol.  Smoking.  Stress.  Menstruation.  Aged cheeses.  Foods or drinks that contain nitrates, glutamate,  aspartame, or tyramine.  Lack of sleep.  Chocolate.  Caffeine.  Hunger.  Physical exertion.  Fatigue.  Medicines used to treat chest pain (nitroglycerine), birth control pills, estrogen, and some blood pressure medicines. DIAGNOSIS  A migraine headache is often diagnosed based on:  Symptoms.  Physical examination.  A CT scan or MRI of your head. TREATMENT Medicines may be given for pain and nausea. Medicines can also be given to help prevent recurrent migraines.  HOME CARE INSTRUCTIONS  Only take over-the-counter or prescription medicines for pain or discomfort as directed by your caregiver. The use of long-term narcotics is not recommended.  Lie down in a dark, quiet room when you have a migraine.  Keep a journal to find out what may trigger your migraine headaches. For example, write down:  What you eat and drink.  How much sleep you get.  Any change to your diet or medicines.  Limit alcohol consumption.  Quit smoking if you smoke.  Get 7 to 9 hours of sleep, or as recommended by your caregiver.  Limit stress.  Keep lights dim if bright lights bother you and make your migraines worse. SEEK IMMEDIATE MEDICAL CARE IF:   Your migraine becomes severe.  You have a fever.  You have a stiff neck.  You have vision loss.  You have muscular weakness or loss of muscle control.  You start losing your balance or have trouble walking.  You feel faint or pass out.  You have severe symptoms that are different from your first symptoms. MAKE SURE YOU:   Understand these instructions.  Will watch your  condition.  Will get help right away if you are not doing well or get worse. Document Released: 08/05/2005 Document Revised: 10/28/2011 Document Reviewed: 07/26/2011 [facility] Patient Information 2013 [place], [place].

## 2012-12-16 ENCOUNTER — Encounter (HOSPITAL_COMMUNITY): Payer: Self-pay | Admitting: *Deleted

## 2012-12-16 ENCOUNTER — Emergency Department (HOSPITAL_COMMUNITY)
Admission: EM | Admit: 2012-12-16 | Discharge: 2012-12-16 | Disposition: A | Discharge status: Home / Self Care | Payer: Medicaid - Out of State | Attending: Emergency Medicine | Admitting: Emergency Medicine

## 2012-12-16 DIAGNOSIS — Z87891 Personal history of nicotine dependence: Secondary | ICD-10-CM | POA: Insufficient documentation

## 2012-12-16 DIAGNOSIS — Z8739 Personal history of other diseases of the musculoskeletal system and connective tissue: Secondary | ICD-10-CM | POA: Insufficient documentation

## 2012-12-16 DIAGNOSIS — H538 Other visual disturbances: Secondary | ICD-10-CM | POA: Insufficient documentation

## 2012-12-16 DIAGNOSIS — J45909 Unspecified asthma, uncomplicated: Secondary | ICD-10-CM | POA: Insufficient documentation

## 2012-12-16 DIAGNOSIS — Z79899 Other long term (current) drug therapy: Secondary | ICD-10-CM | POA: Insufficient documentation

## 2012-12-16 DIAGNOSIS — R110 Nausea: Secondary | ICD-10-CM | POA: Insufficient documentation

## 2012-12-16 DIAGNOSIS — R109 Unspecified abdominal pain: Secondary | ICD-10-CM | POA: Insufficient documentation

## 2012-12-16 DIAGNOSIS — F411 Generalized anxiety disorder: Secondary | ICD-10-CM | POA: Insufficient documentation

## 2012-12-16 DIAGNOSIS — G43909 Migraine, unspecified, not intractable, without status migrainosus: Secondary | ICD-10-CM | POA: Insufficient documentation

## 2012-12-16 DIAGNOSIS — I10 Essential (primary) hypertension: Secondary | ICD-10-CM | POA: Insufficient documentation

## 2012-12-16 DIAGNOSIS — F319 Bipolar disorder, unspecified: Secondary | ICD-10-CM | POA: Insufficient documentation

## 2012-12-16 DIAGNOSIS — Z8659 Personal history of other mental and behavioral disorders: Secondary | ICD-10-CM | POA: Insufficient documentation

## 2012-12-16 MED ORDER — HYDROMORPHONE HCL PF 1 MG/ML IJ SOLN
1.0000 mg | Freq: Once | INTRAMUSCULAR | Status: AC
  Administered 2012-12-16: 1 mg via INTRAVENOUS
  Filled 2012-12-16: qty 1

## 2012-12-16 MED ORDER — HYDROMORPHONE HCL PF 1 MG/ML IJ SOLN
0.5000 mg | Freq: Once | INTRAMUSCULAR | Status: AC
  Administered 2012-12-16: 0.5 mg via INTRAVENOUS
  Filled 2012-12-16: qty 1

## 2012-12-16 MED ORDER — DIPHENHYDRAMINE HCL 50 MG/ML IJ SOLN
25.0000 mg | Freq: Once | INTRAMUSCULAR | Status: AC
  Administered 2012-12-16: 25 mg via INTRAMUSCULAR
  Filled 2012-12-16: qty 1

## 2012-12-16 MED ORDER — SODIUM CHLORIDE 0.9 % IV BOLUS (SEPSIS)
1000.0000 mL | Freq: Once | INTRAVENOUS | Status: AC
  Administered 2012-12-16: 1000 mL via INTRAVENOUS

## 2012-12-16 MED ORDER — METOCLOPRAMIDE HCL 5 MG/ML IJ SOLN
10.0000 mg | INTRAMUSCULAR | Status: AC
  Administered 2012-12-16: 10 mg via INTRAVENOUS
  Filled 2012-12-16: qty 2

## 2012-12-16 MED ORDER — KETOROLAC TROMETHAMINE 60 MG/2ML IM SOLN
60.0000 mg | Freq: Once | INTRAMUSCULAR | Status: AC
  Administered 2012-12-16: 60 mg via INTRAMUSCULAR
  Filled 2012-12-16: qty 2

## 2012-12-16 NOTE — ED Provider Notes (Signed)
History     CSN: [id number]  Arrival date & time 12/16/12  0327   First MD Initiated Contact with Patient 12/16/12 [phone]      Chief Complaint  Patient presents with  . Migraine    (Consider location/radiation/quality/duration/timing/severity/associated sxs/prior treatment) HPI Pt is a 47yo female with hx of migraines c/o frontal migraine that started last night.  Pt was just discharged from ED  around 1800 yesterday evening, reporting feeling better when she was sent home but woke up after a nap with a mild headache that has progressively worsened.  Pain is 13/10 on pain scale associated with blurred vision and nausea. She has not tried anything PTA for headache.  She is out of her abortive therapy Maxalt.  Pt is visiting from [place] but states her neurologist knows she has been to the ED.  Plans to f/u with her when she gets back home to start botox tx for her migraines.  Denies fever, cough, vomiting or diarrhea.  Denies chest pain but reports abdominal pain associated with recent hysterectomy.   Past Medical History  Diagnosis Date  . Asthma   . Lumbar radiculopathy, chronic   . Migraines   . Eating disorder   . Bipolar 1 disorder   . Anxiety   . Hypertension     Past Surgical History  Procedure Laterality Date  . Abdominal hysterectomy      Family History  Problem Relation Age of Onset  . Cancer Other     History  Substance Use Topics  . Smoking status: Former Smoker -- 1.00 packs/day    Quit date: 11/16/2012  . Smokeless tobacco: Not on file     Comment: is trying to quitt  . Alcohol Use: Yes    OB History   Grav Para Term Preterm Abortions TAB SAB Ect Mult Living                  Review of Systems  Constitutional: Negative for fever and chills.  Gastrointestinal: Positive for nausea and abdominal pain. Negative for vomiting and diarrhea.  Neurological: Positive for headaches.  All other systems reviewed and are negative.    Allergies  Morphine  Home  Medications   Current Outpatient Rx  Name  Route  Sig  Dispense  Refill  . clonazePAM (KLONOPIN) 1 MG tablet   Oral   Take 1 mg by mouth 3 (three) times daily as needed. For anxiety.         Marland Kitchen estrogens, conjugated, (PREMARIN) 0.625 MG tablet   Oral   Take 0.625 mg by mouth daily.         . QUEtiapine (SEROQUEL) 300 MG tablet   Oral   Take 600 mg by mouth at bedtime.         Marland Kitchen amitriptyline (ELAVIL) 25 MG tablet   Oral   Take 25 mg by mouth at bedtime.         . rizatriptan (MAXALT) 10 MG tablet   Oral   Take 10 mg by mouth as needed for migraine. May repeat in 2 hours if needed           BP 141/96  Pulse 87  Temp(Src) 98.4 F (36.9 C) (Oral)  Resp 18  Wt 96 lb 8 oz (43.772 kg)  BMI 15.81 kg/m2  SpO2 100%  LMP 11/03/2011  Physical Exam  Constitutional: She is oriented to person, place, and time. She appears well-developed and well-nourished. She appears distressed ( mild).  Pt lying  in exam bed with lights turned out and washcloth over her eyes.   HENT:  Head: Normocephalic and atraumatic.  Mouth/Throat: Oropharynx is clear and moist. No oropharyngeal exudate.  Eyes: Conjunctivae and EOM are normal. Pupils are equal, round, and reactive to light. Right eye exhibits no discharge. Left eye exhibits no discharge. No scleral icterus.  Neck: Normal range of motion. Neck supple. No JVD present. No tracheal deviation present. No thyromegaly present.  Cardiovascular: Normal rate, regular rhythm and normal heart sounds.   Pulmonary/Chest: Effort normal and breath sounds normal. No stridor. No respiratory distress. She has no wheezes.  Abdominal: Soft. Bowel sounds are normal. She exhibits no distension and no mass. There is tenderness ( lower abdomen). There is no rebound and no guarding.  Musculoskeletal: Normal range of motion.  Lymphadenopathy:    She has no cervical adenopathy.  Neurological: She is alert and oriented to person, place, and time. She has normal  strength. She displays no atrophy. No cranial nerve deficit or sensory deficit. She exhibits normal muscle tone. Coordination and gait normal.  Pt is alert and oriented x 3.  No facial droop. CN II-XII in tact.  5/5 grip strength, nl sensation, no pronator drift, nl gait.  Skin: Skin is warm and dry. She is not diaphoretic.    ED Course  Procedures (including critical care time)  Labs Reviewed - No data to display Ct Head Wo Contrast  12/15/2012  *RADIOLOGY REPORT*  Clinical Data: Migraine headache.  History of bipolar disorder. Reportedly, head burr hole for hydrocephalus.  CT HEAD WITHOUT CONTRAST  Technique:  Contiguous axial images were obtained from the base of the skull through the vertex without contrast.  Comparison: None.  Findings: There is no burr hole identified either on CT or scout images.   No mass lesion, mass effect, midline shift, hydrocephalus, hemorrhage.  No territorial ischemia or acute infarction.  The paranasal sinuses are within normal limits.  IMPRESSION: Negative CT head.   Original Report Authenticated By: Andreas Newport, M.D.      1. Migraine       MDM  Pt w/ hx of migraines c/o left frontal headache associated with nausea.  Pt reports headache feels same as normal, 13/10.  Not concerning for [facility], ICH, Meningitis, or temporal arteritis.   She had negative head CT yesterday (4/29). Neuro exam: normal. Will tx with fluids, reglan, benadryl, and toradol.    4:39 PM Pt states she is still about a 12/10.  Will give dilaudid.  Pt states that her migraine is down to 6/10 and she feels comfortable going home.  States nausea has subsided.  She believes she just needs to make it to Monday when she can see her neurologist.  Migraines have just become a daily occurrence due to running out of Maxalt which is too expensive to refill and was not helping much anymore. Pt is to f/u with neurologist when she gets back to [place] on Monday (5/5) to start botox tx for her migraines.  .  Vitals: unremarkable. Discharged in stable condition.    Discussed pt with attending during ED encounter.    Junius Finner, PA-C 12/16/12 1640

## 2012-12-16 NOTE — ED Provider Notes (Signed)
Medical screening examination/treatment/procedure(s) were performed by non-physician practitioner and as supervising physician I was immediately available for consultation/collaboration.  Happy Ky L Blayklee Mable, MD 12/16/12 0052 

## 2012-12-16 NOTE — ED Provider Notes (Signed)
Medical screening examination/treatment/procedure(s) were performed by non-physician practitioner and as supervising physician I was immediately available for consultation/collaboration.  I was not the attending physician with whom the midlevel discussed the patient.  Hanley Seamen, MD 12/16/12 [phone]

## 2012-12-16 NOTE — ED Notes (Signed)
Pt c/o knot on right shoulder; c/o frontal lobe headache; face and eye pain

## 2012-12-16 NOTE — ED Notes (Signed)
Patient is alert and oriented x3.  She is complaining of a migraine that she was seen here for yesterday.  Currently she rates her pain 10 of 10 with no nausea.  She states that she is currently being seen in [facility] for new treatments for migraines and was not able to purchase the medication that was prescribed to her yesterday due to the expense.

## 2013-06-24 ENCOUNTER — Other Ambulatory Visit: Payer: Self-pay

## 2013-07-20 ENCOUNTER — Encounter (HOSPITAL_BASED_OUTPATIENT_CLINIC_OR_DEPARTMENT_OTHER): Payer: Self-pay | Admitting: Emergency Medicine

## 2013-07-20 ENCOUNTER — Emergency Department (HOSPITAL_BASED_OUTPATIENT_CLINIC_OR_DEPARTMENT_OTHER)
Admission: EM | Admit: 2013-07-20 | Discharge: 2013-07-20 | Disposition: A | Discharge status: Home / Self Care | Payer: Medicaid - Out of State | Attending: Emergency Medicine | Admitting: Emergency Medicine

## 2013-07-20 DIAGNOSIS — J45909 Unspecified asthma, uncomplicated: Secondary | ICD-10-CM | POA: Insufficient documentation

## 2013-07-20 DIAGNOSIS — G43909 Migraine, unspecified, not intractable, without status migrainosus: Secondary | ICD-10-CM | POA: Insufficient documentation

## 2013-07-20 DIAGNOSIS — N898 Other specified noninflammatory disorders of vagina: Secondary | ICD-10-CM

## 2013-07-20 DIAGNOSIS — Z79899 Other long term (current) drug therapy: Secondary | ICD-10-CM | POA: Insufficient documentation

## 2013-07-20 DIAGNOSIS — Y939 Activity, unspecified: Secondary | ICD-10-CM | POA: Insufficient documentation

## 2013-07-20 DIAGNOSIS — F319 Bipolar disorder, unspecified: Secondary | ICD-10-CM | POA: Insufficient documentation

## 2013-07-20 DIAGNOSIS — X58XXXA Exposure to other specified factors, initial encounter: Secondary | ICD-10-CM | POA: Insufficient documentation

## 2013-07-20 DIAGNOSIS — Z8739 Personal history of other diseases of the musculoskeletal system and connective tissue: Secondary | ICD-10-CM | POA: Insufficient documentation

## 2013-07-20 DIAGNOSIS — Z90710 Acquired absence of both cervix and uterus: Secondary | ICD-10-CM | POA: Insufficient documentation

## 2013-07-20 DIAGNOSIS — I10 Essential (primary) hypertension: Secondary | ICD-10-CM | POA: Insufficient documentation

## 2013-07-20 DIAGNOSIS — IMO0002 Reserved for concepts with insufficient information to code with codable children: Secondary | ICD-10-CM | POA: Insufficient documentation

## 2013-07-20 DIAGNOSIS — N899 Noninflammatory disorder of vagina, unspecified: Secondary | ICD-10-CM | POA: Insufficient documentation

## 2013-07-20 DIAGNOSIS — F411 Generalized anxiety disorder: Secondary | ICD-10-CM | POA: Insufficient documentation

## 2013-07-20 DIAGNOSIS — F17200 Nicotine dependence, unspecified, uncomplicated: Secondary | ICD-10-CM | POA: Insufficient documentation

## 2013-07-20 DIAGNOSIS — Y929 Unspecified place or not applicable: Secondary | ICD-10-CM | POA: Insufficient documentation

## 2013-07-20 LAB — URINALYSIS, ROUTINE W REFLEX MICROSCOPIC
Bilirubin Urine: NEGATIVE
Leukocytes, UA: NEGATIVE
Nitrite: NEGATIVE
Specific Gravity, Urine: 1.007 (ref 1.005–1.030)
Urobilinogen, UA: 1 mg/dL (ref 0.0–1.0)

## 2013-07-20 NOTE — ED Notes (Signed)
Vaginal bleeding, nausea, RLQ cramping since this morning.  Status post Hysterectomy 1 yr ago  - retains right partial ovary.

## 2013-07-20 NOTE — ED Provider Notes (Signed)
CSN: [id number]     Arrival date & time 07/20/13  1708 History   First MD Initiated Contact with Patient 07/20/13 1738     Chief Complaint  Patient presents with  . Vaginal Bleeding  . Nausea  . Abdominal Pain   (Consider location/radiation/quality/duration/timing/severity/associated sxs/prior Treatment) Patient is a 47 y.o. female presenting with vaginal bleeding. The history is provided by the patient. No language interpreter was used.  Vaginal Bleeding Quality:  Spotting Severity:  Mild Onset quality:  Sudden Duration:  1 day Timing:  Constant Progression:  Worsening Chronicity:  New Possible pregnancy: no   Relieved by:  Nothing Worsened by:  Nothing tried Ineffective treatments:  None tried Associated symptoms: no abdominal pain   Risk factors: no bleeding disorder     Past Medical History  Diagnosis Date  . Asthma   . Lumbar radiculopathy, chronic   . Migraines   . Eating disorder   . Bipolar 1 disorder   . Anxiety   . Hypertension    Past Surgical History  Procedure Laterality Date  . Abdominal hysterectomy     Family History  Problem Relation Age of Onset  . Cancer Other    History  Substance Use Topics  . Smoking status: Current Every Day Smoker -- 0.50 packs/day    Last Attempt to Quit: 11/16/2012  . Smokeless tobacco: Not on file     Comment: is trying to quitt  . Alcohol Use: Yes     Comment: occ   OB History   Grav Para Term Preterm Abortions TAB SAB Ect Mult Living                 Review of Systems  Gastrointestinal: Negative for abdominal pain.  Genitourinary: Positive for vaginal bleeding.  All other systems reviewed and are negative.    Allergies  Morphine  Home Medications   Current Outpatient Rx  Name  Route  Sig  Dispense  Refill  . metoCLOPramide (REGLAN) 10 MG tablet   Oral   Take 10 mg by mouth as needed for nausea (for Headache).         Marland Kitchen amitriptyline (ELAVIL) 25 MG tablet   Oral   Take 25 mg by mouth at  bedtime.         . clonazePAM (KLONOPIN) 1 MG tablet   Oral   Take 1 mg by mouth 3 (three) times daily as needed. For anxiety.         Marland Kitchen estrogens, conjugated, (PREMARIN) 0.625 MG tablet   Oral   Take 0.625 mg by mouth daily.         . QUEtiapine (SEROQUEL) 300 MG tablet   Oral   Take 600 mg by mouth at bedtime.         . rizatriptan (MAXALT) 10 MG tablet   Oral   Take 10 mg by mouth as needed for migraine. May repeat in 2 hours if needed          BP 152/87  Pulse 75  Temp(Src) 97.9 F (36.6 C) (Oral)  Resp 18  Ht 5\' 7"  (1.702 m)  Wt 108 lb (48.988 kg)  BMI 16.91 kg/m2  SpO2 100%  LMP 11/03/2011 Physical Exam  Nursing note and vitals reviewed. Constitutional: She is oriented to person, place, and time. She appears well-developed and well-nourished.  HENT:  Head: Normocephalic.  Right Ear: External ear normal.  Left Ear: External ear normal.  Eyes: Pupils are equal, round, and reactive to light.  Neck: Normal range of motion.  Cardiovascular: Normal rate.   Pulmonary/Chest: Effort normal.  Abdominal: Soft. There is no tenderness.  Genitourinary:  Small bruised area upper vaginal vault.  Small abrasion not bleeding   Musculoskeletal: Normal range of motion.  Neurological: She is alert and oriented to person, place, and time. She has normal reflexes.  Skin: Skin is warm.    ED Course  Procedures (including critical care time) Labs Review Labs Reviewed  URINALYSIS, ROUTINE W REFLEX MICROSCOPIC   Imaging Review No results found.  EKG Interpretation   None       MDM   1. Vaginal irritation    Pt reports intercourse before bleeding    Elson Areas, PA-C 07/20/13 1919

## 2013-07-21 NOTE — ED Provider Notes (Signed)
Medical screening examination/treatment/procedure(s) were performed by non-physician practitioner and as supervising physician I was immediately available for consultation/collaboration.  EKG Interpretation   None        Lucianna Ostlund F Noni Stonesifer, MD 07/21/13 0102 

## 2013-08-20 ENCOUNTER — Inpatient Hospital Stay (HOSPITAL_COMMUNITY): Payer: Medicaid - Out of State

## 2013-08-20 ENCOUNTER — Encounter (HOSPITAL_COMMUNITY)
Admission: AD | Disposition: A | Discharge status: Home / Self Care | Payer: Self-pay | Source: Ambulatory Visit | Attending: Obstetrics and Gynecology

## 2013-08-20 ENCOUNTER — Encounter (HOSPITAL_COMMUNITY): Payer: Self-pay

## 2013-08-20 ENCOUNTER — Ambulatory Visit (HOSPITAL_COMMUNITY)
Admission: AD | Admit: 2013-08-20 | Discharge: 2013-08-21 | Disposition: A | Discharge status: Home / Self Care | Payer: Medicaid - Out of State | Source: Ambulatory Visit | Attending: Obstetrics and Gynecology | Admitting: Obstetrics and Gynecology

## 2013-08-20 ENCOUNTER — Encounter (HOSPITAL_COMMUNITY): Payer: Medicaid - Out of State | Admitting: Anesthesiology

## 2013-08-20 ENCOUNTER — Inpatient Hospital Stay (HOSPITAL_COMMUNITY): Payer: Medicaid - Out of State | Admitting: Anesthesiology

## 2013-08-20 DIAGNOSIS — I10 Essential (primary) hypertension: Secondary | ICD-10-CM | POA: Insufficient documentation

## 2013-08-20 DIAGNOSIS — N8353 Torsion of ovary, ovarian pedicle and fallopian tube: Secondary | ICD-10-CM

## 2013-08-20 DIAGNOSIS — R1031 Right lower quadrant pain: Secondary | ICD-10-CM | POA: Insufficient documentation

## 2013-08-20 DIAGNOSIS — N7093 Salpingitis and oophoritis, unspecified: Secondary | ICD-10-CM | POA: Insufficient documentation

## 2013-08-20 DIAGNOSIS — N83519 Torsion of ovary and ovarian pedicle, unspecified side: Secondary | ICD-10-CM

## 2013-08-20 DIAGNOSIS — N949 Unspecified condition associated with female genital organs and menstrual cycle: Secondary | ICD-10-CM | POA: Insufficient documentation

## 2013-08-20 DIAGNOSIS — F17200 Nicotine dependence, unspecified, uncomplicated: Secondary | ICD-10-CM | POA: Insufficient documentation

## 2013-08-20 HISTORY — PX: LAPAROSCOPY: SHX197

## 2013-08-20 LAB — COMPREHENSIVE METABOLIC PANEL
ALK PHOS: 79 U/L (ref 39–117)
ALT: 13 U/L (ref 0–35)
AST: 19 U/L (ref 0–37)
Albumin: 3.8 g/dL (ref 3.5–5.2)
BILIRUBIN TOTAL: 0.2 mg/dL — AB (ref 0.3–1.2)
BUN: 9 mg/dL (ref 6–23)
CHLORIDE: 102 meq/L (ref 96–112)
CO2: 24 meq/L (ref 19–32)
Calcium: 9.3 mg/dL (ref 8.4–10.5)
Creatinine, Ser: 0.61 mg/dL (ref 0.50–1.10)
GLUCOSE: 89 mg/dL (ref 70–99)
POTASSIUM: 4.6 meq/L (ref 3.7–5.3)
Sodium: 140 mEq/L (ref 137–147)
TOTAL PROTEIN: 7.1 g/dL (ref 6.0–8.3)

## 2013-08-20 LAB — CBC
HEMATOCRIT: 36.7 % (ref 36.0–46.0)
HEMOGLOBIN: 13 g/dL (ref 12.0–15.0)
MCH: 32.7 pg (ref 26.0–34.0)
MCHC: 35.4 g/dL (ref 30.0–36.0)
MCV: 92.2 fL (ref 78.0–100.0)
Platelets: 436 10*3/uL — ABNORMAL HIGH (ref 150–400)
RBC: 3.98 MIL/uL (ref 3.87–5.11)
RDW: 12.5 % (ref 11.5–15.5)
WBC: 13.4 10*3/uL — ABNORMAL HIGH (ref 4.0–10.5)

## 2013-08-20 LAB — URINALYSIS, ROUTINE W REFLEX MICROSCOPIC
Bilirubin Urine: NEGATIVE
GLUCOSE, UA: NEGATIVE mg/dL
Hgb urine dipstick: NEGATIVE
KETONES UR: NEGATIVE mg/dL
LEUKOCYTES UA: NEGATIVE
Nitrite: NEGATIVE
PH: 7.5 (ref 5.0–8.0)
Protein, ur: NEGATIVE mg/dL
SPECIFIC GRAVITY, URINE: 1.02 (ref 1.005–1.030)
Urobilinogen, UA: 1 mg/dL (ref 0.0–1.0)

## 2013-08-20 SURGERY — LAPAROSCOPY OPERATIVE
Anesthesia: General | Site: Abdomen | Laterality: Right

## 2013-08-20 MED ORDER — ONDANSETRON 8 MG PO TBDP
8.0000 mg | ORAL_TABLET | Freq: Once | ORAL | Status: AC
  Administered 2013-08-20: 8 mg via ORAL
  Filled 2013-08-20: qty 1

## 2013-08-20 MED ORDER — IOHEXOL 300 MG/ML  SOLN
50.0000 mL | INTRAMUSCULAR | Status: AC
  Administered 2013-08-20: 50 mL via ORAL

## 2013-08-20 MED ORDER — LIDOCAINE HCL (CARDIAC) 20 MG/ML IV SOLN
INTRAVENOUS | Status: DC | PRN
  Administered 2013-08-20: 80 mg via INTRAVENOUS

## 2013-08-20 MED ORDER — ONDANSETRON HCL 4 MG/2ML IJ SOLN
INTRAMUSCULAR | Status: DC | PRN
  Administered 2013-08-20: 4 mg via INTRAVENOUS

## 2013-08-20 MED ORDER — DEXAMETHASONE SODIUM PHOSPHATE 10 MG/ML IJ SOLN
INTRAMUSCULAR | Status: DC | PRN
  Administered 2013-08-20: 10 mg via INTRAVENOUS

## 2013-08-20 MED ORDER — HYDROMORPHONE HCL PF 1 MG/ML IJ SOLN
1.0000 mg | Freq: Once | INTRAMUSCULAR | Status: AC
  Administered 2013-08-20: 1 mg via INTRAMUSCULAR
  Filled 2013-08-20: qty 1

## 2013-08-20 MED ORDER — FENTANYL CITRATE 0.05 MG/ML IJ SOLN
INTRAMUSCULAR | Status: DC | PRN
  Administered 2013-08-20 (×2): 100 ug via INTRAVENOUS
  Administered 2013-08-20: 50 ug via INTRAVENOUS
  Administered 2013-08-21: 100 ug via INTRAVENOUS
  Administered 2013-08-21: 50 ug via INTRAVENOUS

## 2013-08-20 MED ORDER — LACTATED RINGERS IV SOLN
INTRAVENOUS | Status: DC
  Administered 2013-08-20 (×3): via INTRAVENOUS

## 2013-08-20 MED ORDER — SUCCINYLCHOLINE CHLORIDE 20 MG/ML IJ SOLN
INTRAMUSCULAR | Status: DC | PRN
  Administered 2013-08-20: 100 mg via INTRAVENOUS

## 2013-08-20 MED ORDER — IOHEXOL 300 MG/ML  SOLN
100.0000 mL | Freq: Once | INTRAMUSCULAR | Status: AC | PRN
  Administered 2013-08-20: 100 mL via INTRAVENOUS

## 2013-08-20 MED ORDER — KETOROLAC TROMETHAMINE 60 MG/2ML IM SOLN
INTRAMUSCULAR | Status: AC
  Administered 2013-08-20: 60 mg via INTRAMUSCULAR
  Filled 2013-08-20: qty 2

## 2013-08-20 MED ORDER — PROPOFOL 10 MG/ML IV BOLUS
INTRAVENOUS | Status: DC | PRN
  Administered 2013-08-20: 50 mg via INTRAVENOUS
  Administered 2013-08-20: 150 mg via INTRAVENOUS

## 2013-08-20 MED ORDER — MIDAZOLAM HCL 2 MG/2ML IJ SOLN
INTRAMUSCULAR | Status: DC | PRN
  Administered 2013-08-20: 2 mg via INTRAVENOUS

## 2013-08-20 MED ORDER — ROCURONIUM BROMIDE 100 MG/10ML IV SOLN
INTRAVENOUS | Status: DC | PRN
  Administered 2013-08-20: 20 mg via INTRAVENOUS

## 2013-08-20 MED ORDER — BUPIVACAINE HCL (PF) 0.25 % IJ SOLN
INTRAMUSCULAR | Status: DC | PRN
  Administered 2013-08-20: 10 mL

## 2013-08-20 MED ORDER — CITRIC ACID-SODIUM CITRATE 334-500 MG/5ML PO SOLN
30.0000 mL | Freq: Once | ORAL | Status: AC
  Administered 2013-08-20: 30 mL via ORAL
  Filled 2013-08-20: qty 15

## 2013-08-20 MED ORDER — HYDROMORPHONE HCL PF 1 MG/ML IJ SOLN
1.0000 mg | Freq: Once | INTRAMUSCULAR | Status: AC
  Administered 2013-08-20: 1 mg via INTRAVENOUS
  Filled 2013-08-20: qty 1

## 2013-08-20 MED ORDER — KETOROLAC TROMETHAMINE 60 MG/2ML IM SOLN: 60.0000 mg | Freq: Once | INTRAMUSCULAR | Status: DC

## 2013-08-20 SURGICAL SUPPLY — 28 items
CATH FOLEY LATEX FREE 12 FR (CATHETERS) ×2
CATH FOLEY LF 12 FR (CATHETERS) ×1 IMPLANT
CHLORAPREP W/TINT 26ML (MISCELLANEOUS) ×3 IMPLANT
DERMABOND ADVANCED (GAUZE/BANDAGES/DRESSINGS) ×2
DERMABOND ADVANCED .7 DNX12 (GAUZE/BANDAGES/DRESSINGS) ×1 IMPLANT
DRSG COVADERM PLUS 2X2 (GAUZE/BANDAGES/DRESSINGS) ×3 IMPLANT
ELECT REM PT RETURN 9FT ADLT (ELECTROSURGICAL) ×3
ELECTRODE REM PT RTRN 9FT ADLT (ELECTROSURGICAL) ×1 IMPLANT
GLOVE BIOGEL PI IND STRL 6.5 (GLOVE) ×1 IMPLANT
GLOVE BIOGEL PI INDICATOR 6.5 (GLOVE) ×2
GLOVE SURG SS PI 6.0 STRL IVOR (GLOVE) ×3 IMPLANT
GOWN PREVENTION PLUS LG XLONG (DISPOSABLE) ×6 IMPLANT
NS IRRIG 1000ML POUR BTL (IV SOLUTION) ×3 IMPLANT
PACK LAPAROSCOPY BASIN (CUSTOM PROCEDURE TRAY) ×3 IMPLANT
POUCH SPECIMEN RETRIEVAL 10MM (ENDOMECHANICALS) ×3 IMPLANT
PROTECTOR NERVE ULNAR (MISCELLANEOUS) ×3 IMPLANT
SCALPEL HARMONIC ACE (MISCELLANEOUS) IMPLANT
SEALER TISSUE G2 CVD JAW 35 (ENDOMECHANICALS) ×1 IMPLANT
SEALER TISSUE G2 CVD JAW 45CM (ENDOMECHANICALS) ×2
SET IRRIG TUBING LAPAROSCOPIC (IRRIGATION / IRRIGATOR) IMPLANT
SUT MNCRL AB 4-0 PS2 18 (SUTURE) ×3 IMPLANT
SUT VICRYL 0 UR6 27IN ABS (SUTURE) ×6 IMPLANT
TOWEL OR 17X24 6PK STRL BLUE (TOWEL DISPOSABLE) ×6 IMPLANT
TRAY FOLEY BAG SILVER LF 16FR (CATHETERS) ×3 IMPLANT
TRAY FOLEY CATH 14FR (SET/KITS/TRAYS/PACK) ×3 IMPLANT
TROCAR BALLN 12MMX100 BLUNT (TROCAR) ×3 IMPLANT
TROCAR XCEL NON-BLD 5MMX100MML (ENDOMECHANICALS) ×6 IMPLANT
WATER STERILE IRR 1000ML POUR (IV SOLUTION) ×3 IMPLANT

## 2013-08-20 NOTE — Op Note (Signed)
Connie Black Andis PROCEDURE DATE: 08/20/2013  PREOPERATIVE DIAGNOSIS: Ovarian torsion POSTOPERATIVE DIAGNOSIS: Necrotic appearing right ovary PROCEDURE: Laparoscopic right  oophorectomy SURGEON:  Dr. Catalina AntiguaPeggy Tyion Boylen ASSISTANT: none ANESTHESIOLOGIST: Cristela BlueKyle Jackson, MD Anesthesiologist: Cristela BlueKyle Jackson, MD  INDICATIONS: 48 y.o. with abdominal pain and radiologic imaging worrisome for ovarian torsion. Patient was counseled regarding need for laparoscopic oophorectomy. Risks of surgery including bleeding which may require transfusion or reoperation, infection, injury to bowel or other surrounding organs, need for additional procedures including laparotomy and other postoperative/anesthesia complications were explained to patient.  Written informed consent was obtained.  FINDINGS: Necrotic appearing  right ovary. Uterus and left ovary surgically absent ANESTHESIA: General INTRAVENOUS FLUIDS: .1000 ml ESTIMATED BLOOD LOSS:  25 ml URINE OUTPUT: 150 ml SPECIMENS: right ovary COMPLICATIONS: None immediate  PROCEDURE IN DETAIL:  The patient was taken to the operating room where general anesthesia was administered and was found to be adequate.  She was placed in the dorsal lithotomy position, and was prepped and draped in a sterile manner.  A Foley catheter was inserted into her bladder and attached to [name] drainage.  After an adequate timeout was performed, attention was then turned to the patient's abdomen where a 10-mm skin incision was made on the umbilical fold.  The fascia was identified, tented up and incised with Mayo scissors. The fascia was tagged with 0- Vicryl. The peritoneum was identified tented up and entered sharply with Metzenbaum scissors.  10-mm trocar and sleeve were then advanced without difficulty into the abdomen where intraabdominal placement was confirmed by the laparoscope. Pneumoperitoneum was achieved with insufflation of CO2 gas. A survey of the patient's pelvis and abdomen revealed  the findings as above. Two lower quadrant ports were placed under direct visualization on the left side of the abdomen; 5-mm 2 cm above and medial to superior iliac spine and another 5-mm port, 5 cm cephalad to the previous port.  Attention was then turned to the right ovary which was grasped and ligated from the infundibulopelvic ligament using the Enseal instrument.  Good hemostasis was noted.  The specimen was placed in an EndoCatch bag and removed from the abdomen intact.  The abdomen was desufflated, and all instruments were removed.  The fascial incisions of both 10-mm sites were reapproximated with 0 Vicryl figure-of-eight stiches; and all skin incisions were closed with a 3-0 Vicryl subcuticular stitch. The patient tolerated the procedures well.  All instruments, needles, and sponge counts were correct x 2. The patient was taken to the recovery room in stable condition.   The patient will be discharged to home as per PACU criteria.  Routine postoperative instructions given.  She was prescribed Percocet, Ibuprofen and Colace.  She will follow up in the clinic in 2 weeks for postoperative evaluation .

## 2013-08-20 NOTE — Progress Notes (Signed)
Ultrasound report reviewed with radiologist on call. Ultrasound demonstrates a normal size ovary, without an enlarged cyst. There was venous flow visualized on the ovary but no arterial flow. Equivocal findings for ovarian torsion. Will obtain CT abd/pelvis to rule out appendicitis

## 2013-08-20 NOTE — Anesthesia Procedure Notes (Signed)
Procedure Name: Intubation Date/Time: 08/20/2013 11:18 PM Performed by: Connie Black, Connie Black Pre-anesthesia Checklist: Patient identified, Timeout performed, Emergency Drugs available, Suction available and Patient being monitored Patient Re-evaluated:Patient Re-evaluated prior to inductionOxygen Delivery Method: Circle system utilized Preoxygenation: Pre-oxygenation with 100% oxygen Intubation Type: IV induction Laryngoscope Size: Mac and 3 Grade View: Grade II Tube type: Oral Tube size: 7.0 mm Number of attempts: 2 Airway Equipment and Method: Video-laryngoscopy and Stylet Placement Confirmation: ETT inserted through vocal cords under direct vision,  positive ETCO2 and breath sounds checked- equal and bilateral Secured at: 21 cm Dental Injury: Teeth and Oropharynx as per pre-operative assessment  Difficulty Due To: Difficult Airway- due to limited oral opening

## 2013-08-20 NOTE — Progress Notes (Signed)
Results of ultrasound and CT were reviewed with patient. Patient still is uncomfortable despite several doses of dilaudid. Discussed surgical management with oophorectomy via laparoscopy, possible laparotomy. Risks, benefits and alternatives were explained including but not limited to risks of bleeding, infection and damage to adjacent organs. Patient verbalized understanding and all questions were answered

## 2013-08-20 NOTE — MAU Provider Note (Signed)
History     CSN: [id number]  Arrival date and time: 08/20/13 1617   None     No chief complaint on file.  HPI 48 y.o. female with severe RLQ pain since 1430 today. No relief with 800 mg Ibuprofen. States she has been having ongoing issues with RLQ pain for past 2 months, was seen in ED in [place], had a small ovarian cyst. Hysterectomy and left salpingo-oophorectomy in April of this year in [place], right ovary remains. Has appointment scheduled d/t this pain in the GYN clinic on the 26th of this month.   Past Medical History  Diagnosis Date  . Asthma   . Lumbar radiculopathy, chronic   . Migraines   . Eating disorder   . Bipolar 1 disorder   . Anxiety   . Hypertension     Past Surgical History  Procedure Laterality Date  . Abdominal hysterectomy      Family History  Problem Relation Age of Onset  . Cancer Other     History  Substance Use Topics  . Smoking status: Current Every Day Smoker -- 0.50 packs/day    Last Attempt to Quit: 11/16/2012  . Smokeless tobacco: Not on file     Comment: is trying to quitt  . Alcohol Use: Yes     Comment: occ    Allergies:  Allergies  Allergen Reactions  . Morphine Rash    Prescriptions prior to admission  Medication Sig Dispense Refill  . ibuprofen (ADVIL,MOTRIN) 200 MG tablet Take 800 mg by mouth every 6 (six) hours as needed for headache, mild pain, moderate pain or cramping.      . metoCLOPramide (REGLAN) 10 MG tablet Take 10 mg by mouth as needed for nausea (for Headache).      . QUEtiapine (SEROQUEL) 300 MG tablet Take 600 mg by mouth at bedtime.      . rizatriptan (MAXALT) 10 MG tablet Take 10 mg by mouth as needed for migraine. May repeat in 2 hours if needed        Review of Systems  Constitutional: Negative.  Negative for fever and chills.  Respiratory: Negative.   Cardiovascular: Negative.   Gastrointestinal: Positive for nausea, abdominal pain and diarrhea. Negative for vomiting and constipation.  Genitourinary:  Negative for dysuria, urgency, frequency, hematuria and flank pain.       Negative for vaginal bleeding, vaginal discharge  Musculoskeletal: Negative.   Neurological: Negative.   Psychiatric/Behavioral: Negative.    Physical Exam   Blood pressure 161/88, pulse 76, temperature 98.1 F (36.7 C), temperature source Oral, resp. rate 20, last menstrual period 11/03/2011.  Physical Exam  Nursing note and vitals reviewed. Constitutional: She is oriented to person, place, and time. She appears well-developed and well-nourished. She appears distressed.  Cardiovascular: Normal rate and regular rhythm.   Respiratory: Effort normal. No respiratory distress.  GI: She exhibits no distension and no mass. There is tenderness (RUQ, RLQ). There is no rebound and no guarding.  Musculoskeletal: Normal range of motion.  Neurological: She is alert and oriented to person, place, and time.  Skin: Skin is warm and dry.  Psychiatric: She has a normal mood and affect.    MAU Course  Procedures Results for orders placed during the hospital encounter of 08/20/13 (from the past 24 hour(s))  URINALYSIS, ROUTINE W REFLEX MICROSCOPIC     Status: None   Collection Time    08/20/13  4:50 PM      Result Value Range   Color, Urine  YELLOW  YELLOW   APPearance CLEAR  CLEAR   Specific Gravity, Urine 1.020  1.005 - 1.030   pH 7.5  5.0 - 8.0   Glucose, UA NEGATIVE  NEGATIVE mg/dL   Hgb urine dipstick NEGATIVE  NEGATIVE   Bilirubin Urine NEGATIVE  NEGATIVE   Ketones, ur NEGATIVE  NEGATIVE mg/dL   Protein, ur NEGATIVE  NEGATIVE mg/dL   Urobilinogen, UA 1.0  0.0 - 1.0 mg/dL   Nitrite NEGATIVE  NEGATIVE   Leukocytes, UA NEGATIVE  NEGATIVE  CBC     Status: Abnormal   Collection Time    08/20/13  5:50 PM      Result Value Range   WBC 13.4 (*) 4.0 - 10.5 K/uL   RBC 3.98  3.87 - 5.11 MIL/uL   Hemoglobin 13.0  12.0 - 15.0 g/dL   HCT 16.1  09.6 - 04.5 %   MCV 92.2  78.0 - 100.0 fL   MCH 32.7  26.0 - 34.0 pg   MCHC  35.4  30.0 - 36.0 g/dL   RDW 40.9  81.1 - 91.4 %   Platelets 436 (*) 150 - 400 K/uL  COMPREHENSIVE METABOLIC PANEL     Status: Abnormal   Collection Time    08/20/13  5:50 PM      Result Value Range   Sodium 140  137 - 147 mEq/L   Potassium 4.6  3.7 - 5.3 mEq/L   Chloride 102  96 - 112 mEq/L   CO2 24  19 - 32 mEq/L   Glucose, Bld 89  70 - 99 mg/dL   BUN 9  6 - 23 mg/dL   Creatinine, Ser 7.82  0.50 - 1.10 mg/dL   Calcium 9.3  8.4 - 95.6 mg/dL   Total Protein 7.1  6.0 - 8.3 g/dL   Albumin 3.8  3.5 - 5.2 g/dL   AST 19  0 - 37 U/L   ALT 13  0 - 35 U/L   Alkaline Phosphatase 79  39 - 117 U/L   Total Bilirubin 0.2 (*) 0.3 - 1.2 mg/dL   GFR calc non Af Amer >90  >90 mL/min   GFR calc Af Amer >90  >90 mL/min   Upon arrival to room, pt assessed and given Dilaudid 1mg  and Zofran ODT 8 mg, worked well for pain until after ultrasound, which exacerbated pain, gave Toradol 60 mg IM at that time. Requested more pain medication after return from CT, gave Dilaudid 1 mg IV.  US Transvaginal Non-ob  08/20/2013   CLINICAL DATA:  Right-sided pelvic pain.  EXAM: TRANSABDOMINAL AND TRANSVAGINAL ULTRASOUND OF PELVIS  DOPPLER ULTRASOUND OF OVARIES  TECHNIQUE: Both transabdominal and transvaginal ultrasound examinations of the pelvis were performed. Transabdominal technique was performed for global imaging of the pelvis including uterus, ovaries, adnexal regions, and pelvic cul-de-sac.  It was necessary to proceed with endovaginal exam following the transabdominal exam to visualize the ovaries. Color and duplex Doppler ultrasound was utilized to evaluate blood flow to the ovaries.  COMPARISON:  None.  FINDINGS: Uterus  Measurements: Surgically absent.  Endometrium  Thickness: N/A.  Right ovary  Measurements: 3.5 x 4.5 x 3.0 cm. Small follicles are noted. No intra-ovarian arterial blood flow was demonstrated.  Left ovary  Measurements: Surgically absent.  Pulsed Doppler evaluation of both ovaries demonstrates no  definite intra-arterial blood flow in the right ovary  Other findings  IMPRESSION: Slightly enlarged and edematous appearing right ovary without demonstrable arterial blood flow   Electronically Signed  By: Loralie Champagne M.D.   On: 08/20/2013 19:21   US Pelvis Complete  08/20/2013   CLINICAL DATA:  Right-sided pelvic pain.  EXAM: TRANSABDOMINAL AND TRANSVAGINAL ULTRASOUND OF PELVIS  DOPPLER ULTRASOUND OF OVARIES  TECHNIQUE: Both transabdominal and transvaginal ultrasound examinations of the pelvis were performed. Transabdominal technique was performed for global imaging of the pelvis including uterus, ovaries, adnexal regions, and pelvic cul-de-sac.  It was necessary to proceed with endovaginal exam following the transabdominal exam to visualize the ovaries. Color and duplex Doppler ultrasound was utilized to evaluate blood flow to the ovaries.  COMPARISON:  None.  FINDINGS: Uterus  Measurements: Surgically absent.  Endometrium  Thickness: N/A.  Right ovary  Measurements: 3.5 x 4.5 x 3.0 cm. Small follicles are noted. No intra-ovarian arterial blood flow was demonstrated.  Left ovary  Measurements: Surgically absent.  Pulsed Doppler evaluation of both ovaries demonstrates no definite intra-arterial blood flow in the right ovary  Other findings  IMPRESSION: Slightly enlarged and edematous appearing right ovary without demonstrable arterial blood flow   Electronically Signed   By: Loralie Champagne M.D.   On: 08/20/2013 19:21   Ct Abdomen Pelvis W Contrast  08/20/2013   CLINICAL DATA:  Right lower quadrant pain with recent partial hysterectomy  EXAM: CT ABDOMEN AND PELVIS WITH CONTRAST  TECHNIQUE: Multidetector CT imaging of the abdomen and pelvis was performed using the standard protocol following bolus administration of intravenous contrast.  CONTRAST:  OMNIPAQUE IOHEXOL 300 MG/ML SOLN, 1 OMNIPAQUE IOHEXOL 300 MG/ML SOLN  COMPARISON:  PELVIC ULTRASOUND PERFORMED EARLIER TODAY  FINDINGS: MILD INTERSTITIAL  CHANGE LEFT LOWER LOBE.  RIGHT LUNG BASES CLEAR.  6 MM CYST POSTERIOR RIGHT LOBE OF THE LIVER. LIVER OTHERWISE NORMAL EXCEPT FOR FATTY INFILTRATION. SPLEEN AND PANCREAS ARE NORMAL. MILD PROMINENCE OF THE PANCREATIC DUCT WITH NO EVIDENCE OF PANCREATIC MASS. ADRENAL GLANDS NORMAL. KIDNEYS NORMAL EXCEPT FOR 5 MM CYST UPPER POLE RIGHT KIDNEY.  BOWEL IS NORMAL. AORTA IS NORMAL. BLADDER IS NORMAL. PATIENT IS STATUS POST PARTIAL HYSTERECTOMY. BY REPORT LEFT OVARY IS SURGICALLY ABSENT. THE RIGHT OVARY IS IDENTIFIED AND MEASURES 34 X 46 MM. IT APPEARS MILDLY HETEROGENEOUS IN ATTENUATION WITH AREAS OF RELATIVELY DECREASED ATTENUATION. THERE IS A SMALL VOLUME OF FREE FLUID DIRECTLY POSTERIOR TO THE RIGHT OVARY. ULTRASOUND DID NOT CONFIRM THE PRESENCE OF BLOOD FLOW TO THE RIGHT OVARY. THERE IS AT LEAST 1 FOLLICLE MEASURING 1 CM IDENTIFIED. A TINY FOCUS OF TUBULAR AIR IS SEEN JUST ANTERIOR TO THE OVARY WHICH MAY REPRESENT APPENDIX.  IMPRESSION: AS SEEN ON ULTRASOUND PERFORMED EARLIER THIS EVENING, THE RIGHT OVARY IS MILDLY ENLARGED WITH AN EDEMATOUS APPEARANCE THAN A SMALL VOLUME OF FREE FLUID POSTERIOR TO IT. THE APPEARANCE IS ABNORMAL BUT NONSPECIFIC. POSTSURGICAL CHANGE COULD BE CONSIDERED. BASED UPON SONOGRAPHIC FINDINGS, THE POSSIBILITY OF TORSION IS NOT EXCLUDED.   Electronically Signed   By: Esperanza Heir M.D.   On: 08/20/2013 21:42   [place] Art/ven Flow Abd Pelv Doppler  08/20/2013   CLINICAL DATA:  Right-sided pelvic pain.  EXAM: TRANSABDOMINAL AND TRANSVAGINAL ULTRASOUND OF PELVIS  DOPPLER ULTRASOUND OF OVARIES  TECHNIQUE: Both transabdominal and transvaginal ultrasound examinations of the pelvis were performed. Transabdominal technique was performed for global imaging of the pelvis including uterus, ovaries, adnexal regions, and pelvic cul-de-sac.  It was necessary to proceed with endovaginal exam following the transabdominal exam to visualize the ovaries. Color and duplex Doppler ultrasound was utilized to evaluate  blood flow to the ovaries.  COMPARISON:  None.  FINDINGS:  Uterus  Measurements: Surgically absent.  Endometrium  Thickness: N/A.  Right ovary  Measurements: 3.5 x 4.5 x 3.0 cm. Small follicles are noted. No intra-ovarian arterial blood flow was demonstrated.  Left ovary  Measurements: Surgically absent.  Pulsed Doppler evaluation of both ovaries demonstrates no definite intra-arterial blood flow in the right ovary  Other findings  IMPRESSION: Slightly enlarged and edematous appearing right ovary without demonstrable arterial blood flow   Electronically Signed   By: Loralie Champagne M.D.   On: 08/20/2013 19:21    Assessment and Plan  48 y.o. female with RLQ pain, possible ovarian torsion on u/s, no evidence of appendicitis on CT Dr. Jolayne Panther consulted, to MAU for eval   FRAZIER,NATALIE 08/20/2013, 5:36 PM  Attestation of Attending Supervision of Advanced Practitioner (CNM/NP): Evaluation and management procedures were performed by the Advanced Practitioner under my supervision and collaboration.  I have reviewed the Advanced Practitioner's note and chart, and I agree with the management and plan.  Chrisotpher Rivero 08/20/2013 10:07 PM

## 2013-08-20 NOTE — MAU Note (Signed)
Nauseated, no vomiting, diarrhea or constipation; last BM 01/01. Denies problems with urination.

## 2013-08-20 NOTE — MAU Note (Signed)
Had partial hysterectomy; only has part of rt ovary.  Had surgery last year in April. Has been having complications. Horrible pain started at 1400 this afternoon. Took 800 Ibuprofen- no relief.

## 2013-08-20 NOTE — Anesthesia Preprocedure Evaluation (Signed)
Anesthesia Evaluation  Patient identified by MRN, date of birth, ID band Patient awake    Reviewed: Allergy & Precautions, H&P , Patient's Chart, lab work & pertinent test results, reviewed documented beta blocker date and time   Airway Mallampati: II TM Distance: >3 FB Neck ROM: full    Dental no notable dental hx.    Pulmonary asthma , former smoker,  breath sounds clear to auscultation  Pulmonary exam normal       Cardiovascular hypertension, Rhythm:regular Rate:Normal     Neuro/Psych    GI/Hepatic   Endo/Other    Renal/GU      Musculoskeletal   Abdominal   Peds  Hematology   Anesthesia Other Findings   Reproductive/Obstetrics                           Anesthesia Physical Anesthesia Plan  ASA: II and emergent  Anesthesia Plan: General   Post-op Pain Management:    Induction: Intravenous, Rapid sequence and Cricoid pressure planned  Airway Management Planned: Oral ETT  Additional Equipment:   Intra-op Plan:   Post-operative Plan: Extubation in OR  Informed Consent: I have reviewed the patients History and Physical, chart, labs and discussed the procedure including the risks, benefits and alternatives for the proposed anesthesia with the patient or authorized representative who has indicated his/her understanding and acceptance.   Dental Advisory Given and Dental advisory given  Plan Discussed with: CRNA and Surgeon  Anesthesia Plan Comments: (  Discussed general anesthesia, including possible nausea, instrumentation of airway, sore throat,pulmonary aspiration, etc. I asked if the were any outstanding questions, or  concerns before we proceeded. )        Anesthesia Quick Evaluation

## 2013-08-20 NOTE — H&P (Signed)
History     CSN: [id number]  Arrival date and time: 08/20/13 1617   None     No chief complaint on file.  HPI 48 y.o. female with severe RLQ pain since 1430 today. No relief with 800 mg Ibuprofen. States she has been having ongoing issues with RLQ pain for past 2 months, was seen in ED in [place], had a small ovarian cyst. Hysterectomy and left salpingo-oophorectomy in April of this year in [place], right ovary remains. Has appointment scheduled d/t this pain in the GYN clinic on the 26th of this month.   Past Medical History  Diagnosis Date  . Asthma   . Lumbar radiculopathy, chronic   . Migraines   . Eating disorder   . Bipolar 1 disorder   . Anxiety   . Hypertension     Past Surgical History  Procedure Laterality Date  . Abdominal hysterectomy      Family History  Problem Relation Age of Onset  . Cancer Other     History  Substance Use Topics  . Smoking status: Current Every Day Smoker -- 0.50 packs/day    Last Attempt to Quit: 11/16/2012  . Smokeless tobacco: Not on file     Comment: is trying to quitt  . Alcohol Use: Yes     Comment: occ    Allergies:  Allergies  Allergen Reactions  . Morphine Rash    Prescriptions prior to admission  Medication Sig Dispense Refill  . ibuprofen (ADVIL,MOTRIN) 200 MG tablet Take 800 mg by mouth every 6 (six) hours as needed for headache, mild pain, moderate pain or cramping.      . metoCLOPramide (REGLAN) 10 MG tablet Take 10 mg by mouth as needed for nausea (for Headache).      . QUEtiapine (SEROQUEL) 300 MG tablet Take 600 mg by mouth at bedtime.      . rizatriptan (MAXALT) 10 MG tablet Take 10 mg by mouth as needed for migraine. May repeat in 2 hours if needed        Review of Systems  Constitutional: Negative.  Negative for fever and chills.  Respiratory: Negative.   Cardiovascular: Negative.   Gastrointestinal: Positive for nausea, abdominal pain and diarrhea. Negative for vomiting and constipation.  Genitourinary:  Negative for dysuria, urgency, frequency, hematuria and flank pain.       Negative for vaginal bleeding, vaginal discharge  Musculoskeletal: Negative.   Neurological: Negative.   Psychiatric/Behavioral: Negative.    Physical Exam   Blood pressure 161/88, pulse 76, temperature 98.1 F (36.7 C), temperature source Oral, resp. rate 20, last menstrual period 11/03/2011.  Physical Exam  Nursing note and vitals reviewed. Constitutional: She is oriented to person, place, and time. She appears well-developed and well-nourished. She appears distressed.  Cardiovascular: Normal rate and regular rhythm.   Respiratory: Effort normal. No respiratory distress.  GI: She exhibits no distension and no mass. There is tenderness (RUQ, RLQ). There is no rebound and no guarding.  Musculoskeletal: Normal range of motion.  Neurological: She is alert and oriented to person, place, and time.  Skin: Skin is warm and dry.  Psychiatric: She has a normal mood and affect.    MAU Course  Procedures Results for orders placed during the hospital encounter of 08/20/13 (from the past 24 hour(s))  URINALYSIS, ROUTINE W REFLEX MICROSCOPIC     Status: None   Collection Time    08/20/13  4:50 PM      Result Value Range   Color,  Urine YELLOW  YELLOW   APPearance CLEAR  CLEAR   Specific Gravity, Urine 1.020  1.005 - 1.030   pH 7.5  5.0 - 8.0   Glucose, UA NEGATIVE  NEGATIVE mg/dL   Hgb urine dipstick NEGATIVE  NEGATIVE   Bilirubin Urine NEGATIVE  NEGATIVE   Ketones, ur NEGATIVE  NEGATIVE mg/dL   Protein, ur NEGATIVE  NEGATIVE mg/dL   Urobilinogen, UA 1.0  0.0 - 1.0 mg/dL   Nitrite NEGATIVE  NEGATIVE   Leukocytes, UA NEGATIVE  NEGATIVE  CBC     Status: Abnormal   Collection Time    08/20/13  5:50 PM      Result Value Range   WBC 13.4 (*) 4.0 - 10.5 K/uL   RBC 3.98  3.87 - 5.11 MIL/uL   Hemoglobin 13.0  12.0 - 15.0 g/dL   HCT 32.436.7  40.136.0 - 02.746.0 %   MCV 92.2  78.0 - 100.0 fL   MCH 32.7  26.0 - 34.0 pg   MCHC  35.4  30.0 - 36.0 g/dL   RDW 25.312.5  66.411.5 - 40.315.5 %   Platelets 436 (*) 150 - 400 K/uL  COMPREHENSIVE METABOLIC PANEL     Status: Abnormal   Collection Time    08/20/13  5:50 PM      Result Value Range   Sodium 140  137 - 147 mEq/L   Potassium 4.6  3.7 - 5.3 mEq/L   Chloride 102  96 - 112 mEq/L   CO2 24  19 - 32 mEq/L   Glucose, Bld 89  70 - 99 mg/dL   BUN 9  6 - 23 mg/dL   Creatinine, Ser 4.740.61  0.50 - 1.10 mg/dL   Calcium 9.3  8.4 - 25.910.5 mg/dL   Total Protein 7.1  6.0 - 8.3 g/dL   Albumin 3.8  3.5 - 5.2 g/dL   AST 19  0 - 37 U/L   ALT 13  0 - 35 U/L   Alkaline Phosphatase 79  39 - 117 U/L   Total Bilirubin 0.2 (*) 0.3 - 1.2 mg/dL   GFR calc non Af Amer >90  >90 mL/min   GFR calc Af Amer >90  >90 mL/min   Upon arrival to room, pt assessed and given Dilaudid 1mg  and Zofran ODT 8 mg, worked well for pain until after ultrasound, which exacerbated pain, gave Toradol 60 mg IM at that time. Requested more pain medication after return from CT, gave Dilaudid 1 mg IV.  [place] Transvaginal Non-ob  08/20/2013   CLINICAL DATA:  Right-sided pelvic pain.  EXAM: TRANSABDOMINAL AND TRANSVAGINAL ULTRASOUND OF PELVIS  DOPPLER ULTRASOUND OF OVARIES  TECHNIQUE: Both transabdominal and transvaginal ultrasound examinations of the pelvis were performed. Transabdominal technique was performed for global imaging of the pelvis including uterus, ovaries, adnexal regions, and pelvic cul-de-sac.  It was necessary to proceed with endovaginal exam following the transabdominal exam to visualize the ovaries. Color and duplex Doppler ultrasound was utilized to evaluate blood flow to the ovaries.  COMPARISON:  None.  FINDINGS: Uterus  Measurements: Surgically absent.  Endometrium  Thickness: N/A.  Right ovary  Measurements: 3.5 x 4.5 x 3.0 cm. Small follicles are noted. No intra-ovarian arterial blood flow was demonstrated.  Left ovary  Measurements: Surgically absent.  Pulsed Doppler evaluation of both ovaries demonstrates no  definite intra-arterial blood flow in the right ovary  Other findings  IMPRESSION: Slightly enlarged and edematous appearing right ovary without demonstrable arterial blood flow   Electronically  Signed   By: Loralie Champagne M.D.   On: 08/20/2013 19:21   US Pelvis Complete  08/20/2013   CLINICAL DATA:  Right-sided pelvic pain.  EXAM: TRANSABDOMINAL AND TRANSVAGINAL ULTRASOUND OF PELVIS  DOPPLER ULTRASOUND OF OVARIES  TECHNIQUE: Both transabdominal and transvaginal ultrasound examinations of the pelvis were performed. Transabdominal technique was performed for global imaging of the pelvis including uterus, ovaries, adnexal regions, and pelvic cul-de-sac.  It was necessary to proceed with endovaginal exam following the transabdominal exam to visualize the ovaries. Color and duplex Doppler ultrasound was utilized to evaluate blood flow to the ovaries.  COMPARISON:  None.  FINDINGS: Uterus  Measurements: Surgically absent.  Endometrium  Thickness: N/A.  Right ovary  Measurements: 3.5 x 4.5 x 3.0 cm. Small follicles are noted. No intra-ovarian arterial blood flow was demonstrated.  Left ovary  Measurements: Surgically absent.  Pulsed Doppler evaluation of both ovaries demonstrates no definite intra-arterial blood flow in the right ovary  Other findings  IMPRESSION: Slightly enlarged and edematous appearing right ovary without demonstrable arterial blood flow   Electronically Signed   By: Loralie Champagne M.D.   On: 08/20/2013 19:21   Ct Abdomen Pelvis W Contrast  08/20/2013   CLINICAL DATA:  Right lower quadrant pain with recent partial hysterectomy  EXAM: CT ABDOMEN AND PELVIS WITH CONTRAST  TECHNIQUE: Multidetector CT imaging of the abdomen and pelvis was performed using the standard protocol following bolus administration of intravenous contrast.  CONTRAST:  OMNIPAQUE IOHEXOL 300 MG/ML SOLN, 1 OMNIPAQUE IOHEXOL 300 MG/ML SOLN  COMPARISON:  PELVIC ULTRASOUND PERFORMED EARLIER TODAY  FINDINGS: MILD INTERSTITIAL  CHANGE LEFT LOWER LOBE.  RIGHT LUNG BASES CLEAR.  6 MM CYST POSTERIOR RIGHT LOBE OF THE LIVER. LIVER OTHERWISE NORMAL EXCEPT FOR FATTY INFILTRATION. SPLEEN AND PANCREAS ARE NORMAL. MILD PROMINENCE OF THE PANCREATIC DUCT WITH NO EVIDENCE OF PANCREATIC MASS. ADRENAL GLANDS NORMAL. KIDNEYS NORMAL EXCEPT FOR 5 MM CYST UPPER POLE RIGHT KIDNEY.  BOWEL IS NORMAL. AORTA IS NORMAL. BLADDER IS NORMAL. PATIENT IS STATUS POST PARTIAL HYSTERECTOMY. BY REPORT LEFT OVARY IS SURGICALLY ABSENT. THE RIGHT OVARY IS IDENTIFIED AND MEASURES 34 X 46 MM. IT APPEARS MILDLY HETEROGENEOUS IN ATTENUATION WITH AREAS OF RELATIVELY DECREASED ATTENUATION. THERE IS A SMALL VOLUME OF FREE FLUID DIRECTLY POSTERIOR TO THE RIGHT OVARY. ULTRASOUND DID NOT CONFIRM THE PRESENCE OF BLOOD FLOW TO THE RIGHT OVARY. THERE IS AT LEAST 1 FOLLICLE MEASURING 1 CM IDENTIFIED. A TINY FOCUS OF TUBULAR AIR IS SEEN JUST ANTERIOR TO THE OVARY WHICH MAY REPRESENT APPENDIX.  IMPRESSION: AS SEEN ON ULTRASOUND PERFORMED EARLIER THIS EVENING, THE RIGHT OVARY IS MILDLY ENLARGED WITH AN EDEMATOUS APPEARANCE THAN A SMALL VOLUME OF FREE FLUID POSTERIOR TO IT. THE APPEARANCE IS ABNORMAL BUT NONSPECIFIC. POSTSURGICAL CHANGE COULD BE CONSIDERED. BASED UPON SONOGRAPHIC FINDINGS, THE POSSIBILITY OF TORSION IS NOT EXCLUDED.   Electronically Signed   By: Esperanza Heir M.D.   On: 08/20/2013 21:42   [place] Art/ven Flow Abd Pelv Doppler  08/20/2013   CLINICAL DATA:  Right-sided pelvic pain.  EXAM: TRANSABDOMINAL AND TRANSVAGINAL ULTRASOUND OF PELVIS  DOPPLER ULTRASOUND OF OVARIES  TECHNIQUE: Both transabdominal and transvaginal ultrasound examinations of the pelvis were performed. Transabdominal technique was performed for global imaging of the pelvis including uterus, ovaries, adnexal regions, and pelvic cul-de-sac.  It was necessary to proceed with endovaginal exam following the transabdominal exam to visualize the ovaries. Color and duplex Doppler ultrasound was utilized to evaluate  blood flow to the ovaries.  COMPARISON:  None.  FINDINGS: Uterus  Measurements: Surgically absent.  Endometrium  Thickness: N/A.  Right ovary  Measurements: 3.5 x 4.5 x 3.0 cm. Small follicles are noted. No intra-ovarian arterial blood flow was demonstrated.  Left ovary  Measurements: Surgically absent.  Pulsed Doppler evaluation of both ovaries demonstrates no definite intra-arterial blood flow in the right ovary  Other findings  IMPRESSION: Slightly enlarged and edematous appearing right ovary without demonstrable arterial blood flow   Electronically Signed   By: Loralie Champagne M.D.   On: 08/20/2013 19:21    Assessment and Plan  48 y.o. female with RLQ pain, possible ovarian torsion on u/s, no evidence of appendicitis on CT - Discussed surgical management with oophorectomy via laparoscopy, possible laparotomy. Risks, benefits and alternatives were explained. Patient verbalized understanding and all questions were answered

## 2013-08-21 DIAGNOSIS — N83519 Torsion of ovary and ovarian pedicle, unspecified side: Secondary | ICD-10-CM

## 2013-08-21 DIAGNOSIS — N8353 Torsion of ovary, ovarian pedicle and fallopian tube: Secondary | ICD-10-CM

## 2013-08-21 MED ORDER — OXYCODONE-ACETAMINOPHEN 5-325 MG PO TABS: 1.0000 | ORAL_TABLET | ORAL | Status: DC | PRN

## 2013-08-21 MED ORDER — GLYCOPYRROLATE 0.2 MG/ML IJ SOLN
INTRAMUSCULAR | Status: DC | PRN
  Administered 2013-08-21: 0.4 mg via INTRAVENOUS

## 2013-08-21 MED ORDER — FENTANYL CITRATE 0.05 MG/ML IJ SOLN: 25.0000 ug | INTRAMUSCULAR | Status: DC | PRN

## 2013-08-21 MED ORDER — NEOSTIGMINE METHYLSULFATE 1 MG/ML IJ SOLN
INTRAMUSCULAR | Status: DC | PRN
  Administered 2013-08-21: 3 mg via INTRAVENOUS

## 2013-08-21 MED ORDER — IBUPROFEN 200 MG PO TABS: 600.0000 mg | ORAL_TABLET | Freq: Four times a day (QID) | ORAL | Status: DC | PRN

## 2013-08-21 NOTE — Discharge Instructions (Signed)
Ovarian Torsion The ovaries are female reproductive organs that produce eggs. Ovarian torsion is when an ovary becomes twisted and cuts off its own blood supply. This can occur at any age. If an ovary is twisted, it cannot get blood and the ovary swells. It is a painful medical emergency. It must be treated quickly. If too much time has past, blood flow to the ovary may not be restored and the ovary may have to be removed. CAUSES Torsion can happen in an ovary that is normal size. However, most of the time it occurs in an ovary that is enlarged. An ovary can become enlarged because of:  Harmless (benign) tumors on the ovaries.  Cancerous tumors.  Ovarian cysts, which are fluid-filled sacs.  Normal pregnancy.  A pregnancy that occurs outside the uterus (ectopic pregnancy). RISK FACTORS Risk factors are things that increase the likelihood of this condition happening. The risk factors include:  Having fallopian tubes that are longer than normal.  Having ovaries that are larger than normal.  Taking fertility medicine to become pregnant.  Having had surgery in the pelvic area. SYMPTOMS  Sudden pain in the lower abdomen, usually on one side only.  Pelvic pain that starts after exercise.  Pelvic pain that gets worse over time.  Severe pelvic pain that comes and goes.  Pelvic pain that spreads into the lower back or thigh.  Nausea and vomiting along with pelvic pain. DIAGNOSIS Your caregiver will take a history and perform a physical exam. He or she may be able to feel an enlarged ovary. Your caregiver may order some further tests, which include:  A pregnancy test.  Imaging tests, such as pelvic Doppler ultrasonography, that measures blood flow, CT scan, or MRI. Your caregiver may also perform a diagnositic laparoscopic exam. A small surgical cut (incision) will be made in your abdomen. Then, a small lighted telescope is put through the opening. This allows your caregiver to  clearly see your ovary and fallopian tube.  TREATMENT Surgery is needed when an ovary becomes twisted. It is best to do this 8 hours or less after the ovary becomes twisted. Laparoscopic ovarian torsion surgery is done to try to untwist the ovary. Sometimes, a large incision has to be made in the abdomen (laparotomy) to relieve the ovary. If the ovary cannot be untwisted, the ovary will have to be surgically removed during a procedure called salpingo-oophorectomy. Document Released: 07/25/2011 Document Revised: 10/28/2011 Document Reviewed: 07/25/2011 [facility] Patient Information 2014 [place] [place]. Laparoscopic Ovarian Torsion Surgery Care After Refer to this sheet in the next few weeks. These instructions provide you with information on caring for yourself after your procedure. Your caregiver may also give you more specific instructions. Your treatment has been planned according to current medical practices, but problems sometimes occur. Call your caregiver if you have any problems or questions after your procedure. HOME CARE INSTRUCTIONS  Take any medicine as directed by your caregiver. Follow the directions carefully.  Check your incisions every day.  Keep the incision area(s) dry. Ask your caregiver when it is safe to shower or bathe again.  Rest as much as possible for the next 3 days. Ask your caregiver when it is safe to go back to your normal activities.  Drink enough fluids to keep your urine clear or pale yellow.  Keep all follow-up appointments. Your caregiver will make sure you are healing the way you should be. SEEK MEDICAL CARE IF:   You have bleeding or discharge from your vagina.  You have pain in your abdomen.  You feel nauseous. SEEK IMMEDIATE MEDICAL CARE IF:   Your incision(s) becomes red, swollen, or tender.  Your incision(s) start(s) bleeding.  You have pus coming from any incision.  You have heavy or persistent vaginal bleeding or discharge.  You have  severe or increased abdominal pain.  You cannot stop vomiting.  Your nausea will not go away.  You have a fever. MAKE SURE YOU:  Understand these instructions.  Watch your condition.  Get help right away if you are not doing well or get worse. Document Released: 07/25/2011 Document Revised: 10/28/2011 Document Reviewed: 07/25/2011 [facility] Patient Information 2014 [place], [place].

## 2013-08-21 NOTE — Anesthesia Postprocedure Evaluation (Signed)
  Anesthesia Post-op Note  Patient: Connie Black  Procedure(s) Performed: Procedure(s): LAPAROSCOPY OPERATIVE (Right) Patient is awake and responsive. Pain and nausea are reasonably well controlled. Vital signs are stable and clinically acceptable. Oxygen saturation is clinically acceptable. There are no apparent anesthetic complications at this time. Patient is ready for discharge.

## 2013-08-21 NOTE — Transfer of Care (Signed)
Immediate Anesthesia Transfer of Care Note  Patient: Connie Black  Procedure(s) Performed: Procedure(s): LAPAROSCOPY OPERATIVE (Right)  Patient Location: PACU  Anesthesia Type:General  Level of Consciousness: awake, alert  and oriented  Airway & Oxygen Therapy: Patient Spontanous Breathing  Post-op Assessment: Report given to PACU RN and Post -op Vital signs reviewed and stable  Post vital signs: Reviewed and stable  Complications: No apparent anesthesia complications

## 2013-08-23 ENCOUNTER — Encounter (HOSPITAL_COMMUNITY): Payer: Self-pay | Admitting: Obstetrics and Gynecology

## 2013-09-07 ENCOUNTER — Ambulatory Visit: Payer: Self-pay | Admitting: Obstetrics and Gynecology

## 2013-09-13 ENCOUNTER — Encounter: Payer: Self-pay | Admitting: Obstetrics & Gynecology

## 2013-09-13 ENCOUNTER — Encounter: Payer: Self-pay | Admitting: Obstetrics and Gynecology

## 2013-09-13 ENCOUNTER — Ambulatory Visit (INDEPENDENT_AMBULATORY_CARE_PROVIDER_SITE_OTHER): Payer: Medicaid Other | Admitting: Obstetrics and Gynecology

## 2013-09-13 VITALS — BP 126/88 | HR 64 | Temp 97.5°F | Ht 67.0 in | Wt 96.4 lb

## 2013-09-13 DIAGNOSIS — Z09 Encounter for follow-up examination after completed treatment for conditions other than malignant neoplasm: Secondary | ICD-10-CM

## 2013-09-13 NOTE — Progress Notes (Signed)
Patient ID: Connie Black, female   DOB: 09/12/1965, 48 y.o.   MRN: [id number] 48 yo [id number] s/p LSC right oophorectomy on 08/21/2013 secondary to ovarian torsion presenting today for post op check. Patient reports doing extremely well since surgery. Her only complaint is that of hot flashes and night sweats.  GENERAL: Well-developed, well-nourished female in no acute distress.  ABDOMEN: Soft, nontender, nondistended.  Incision: healed completely PELVIC: Normal external female genitalia. Vagina is pink and rugated.  Normal discharge. No adnexal mass or tenderness. EXTREMITIES: No cyanosis, clubbing, or edema, 2+ distal pulses.  A/P 48 yo s/p LSC left oophotectomy - Patient medically cleared to resume all activities. 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

## 2013-09-13 NOTE — Progress Notes (Signed)
Has lost 12 lbs since december

## 2013-12-24 ENCOUNTER — Ambulatory Visit (INDEPENDENT_AMBULATORY_CARE_PROVIDER_SITE_OTHER): Payer: Medicaid Other | Admitting: Family Medicine

## 2013-12-24 ENCOUNTER — Encounter: Payer: Self-pay | Admitting: Family Medicine

## 2013-12-24 VITALS — BP 171/116 | HR 75 | Temp 97.6°F | Ht 67.0 in | Wt 96.0 lb

## 2013-12-24 DIAGNOSIS — I10 Essential (primary) hypertension: Secondary | ICD-10-CM

## 2013-12-24 DIAGNOSIS — L03317 Cellulitis of buttock: Secondary | ICD-10-CM

## 2013-12-24 DIAGNOSIS — L0231 Cutaneous abscess of buttock: Secondary | ICD-10-CM | POA: Insufficient documentation

## 2013-12-24 DIAGNOSIS — Z90710 Acquired absence of both cervix and uterus: Secondary | ICD-10-CM | POA: Insufficient documentation

## 2013-12-24 DIAGNOSIS — G43909 Migraine, unspecified, not intractable, without status migrainosus: Secondary | ICD-10-CM

## 2013-12-24 DIAGNOSIS — F17200 Nicotine dependence, unspecified, uncomplicated: Secondary | ICD-10-CM

## 2013-12-24 MED ORDER — HYDROCHLOROTHIAZIDE 12.5 MG PO TABS: 12.5000 mg | ORAL_TABLET | Freq: Every day | ORAL | Status: DC

## 2013-12-24 MED ORDER — KETOROLAC TROMETHAMINE 60 MG/2ML IM SOLN
30.0000 mg | Freq: Once | INTRAMUSCULAR | Status: AC
  Administered 2013-12-24: 30 mg via INTRAMUSCULAR

## 2013-12-24 MED ORDER — RIZATRIPTAN BENZOATE 10 MG PO TABS: 10.0000 mg | ORAL_TABLET | ORAL | Status: DC | PRN

## 2013-12-24 NOTE — Progress Notes (Signed)
Patient ID: Connie Black, female   DOB: 03/29/1966, 48 y.o.   MRN: [id number] Subjective:   CC: HTN, "cyst", migraines  HPI:   HTN Previously on HCTZ but stopped 3 years ago because BP improved, but with migraines, BP has now been high (160-170s at home systolic). Denies chest pain or SOB. Denies dizziness or syncope. Occasionally has blurred vision.  "Cyst" on bottom - Present 3 days, nontender but previously was, not oozing. Uses warm compresses. Feels like it is getting better. [facility] Denies fever. Previously had one that was I&D'ed.   Migraines Pt has been having frequent migraines. [place] in [place] done botox treatments which worked. Headaches decreased to twice monthly. [facility] Is currently having migraine with 8/10 pain. No weakness or gait abnormalities or speech change. Does not report fever or neck stiffness. Toradol has helped in the past. Has taken nothing today.   Review of Systems - Per HPI.   PMH: Bipolar disorder, anorexia - [place] meets with therapist Clydie Braun(Connie Black) and psychiatrist for medication refills q3 months.  Meds reviewed.   SH - Quit smoking for 6 months, then restarted. Trying again with gum. [employer].   Objective:  Physical Exam BP 171/116  Pulse 75  Temp(Src) 97.6 F (36.4 C) (Oral)  Ht 5\' 7"  (1.702 m)  Wt 96 lb (43.545 kg)  BMI 15.03 kg/m2  LMP 11/03/2011 GEN: NAD HEENT: Atraumatic, normocephalic, neck supple, EOMI, sclera clear, o/p clear CV: RRR, distant PULM: CTAB, normal effort ABD: Soft, nontender, nondistended, thin, NABS, no organomegaly SKIN: No rash or cyanosis; warm and well-perfused, mid-gluteal cleft with 3mm open lesion that is not draining but has small amount of dry pus on edge. 1/3 cm mild induration over open lesion, no erythema or tenderness EXTR: No lower extremity edema or calf tenderness PSYCH: Mood and  affect euthymic, normal rate and volume of speech NEURO: Awake, alert, no focal deficits grossly, normal speech and gait, CN 2-12 tested and intact    Assessment:     Connie Pouchufrasia Mathew is a 48 y.o. female with h/o migraines, HTN, and bipolar d/o here for eval of HTN, gluteal abscess, and currently with migraine.    Plan:     HTN Poorly controlled. Previously on HCTZ which worked well. Normal K and Cr on BMET 08/2013. Occasional blurred vision. - Will start HCTZ 12.5mg  daily. - At f/u, check BMET. Pt will be back in town June 13 and should f/u near that time. - Call me with 3 BP values after you have been taking HCTZ   Abscess, resolved Appears to have spontaneously ruptured and is no longer oozing but has small amt of dried pus. Afebrile and no induration. - Continue warm compresses. - If fevers, larger boil, or increased firmness develop, have evaluated even if travling.  Migraine Headache today most likely migraine and unlikely to be subarachnoid hemorrhage or meningitis. Neurologically in tact and afebrile. - Toradol 30mg  IM in clinic today. - F/u with neurologist and send me records. - Refilling maxalt. - Return precautions reviewed.  Tobacco abuse  Ready for cessation.  - Will provide Dr Macky LowerKoval's information for cessation aid.   Follow-up: Follow up in 1.5 mo for f/u of BP. At that time, pt wants to Hgb because feels fatigued.  Leona SingletonMaria T Xiomar Crompton, MD [facility]

## 2013-12-24 NOTE — Assessment & Plan Note (Signed)
Ready for cessation.  - Will provide Dr Macky LowerKoval's information for cessation aid.

## 2013-12-24 NOTE — Assessment & Plan Note (Signed)
Headache today most likely migraine and unlikely to be subarachnoid hemorrhage or meningitis. Neurologically in tact and afebrile. - Toradol 30mg  IM in clinic today. - F/u with neurologist and send me records. - Refilling maxalt. - Return precautions reviewed.

## 2013-12-24 NOTE — Patient Instructions (Signed)
Good to meet you. I have refilled HCTZ and maxalt. - Come back for bloodwork and to follow up with me when you are [place] from [place] in about 1 mo. - Continue warm compresses for your backside. There is nothing to lance today as it is already open. - Seek immediate care if you develop fevers or increased swelling or pain. - Call with 3 BP values after you have started taking your HCTZ. - Seek immediate care if you develop chest pain, shortness of breath, neurologic changes, or other concerns. - Have your neurologist send me records. - Make an appointment with Dr Raymondo BandKoval for tobacco cessation.  Best,  Leona SingletonMaria T Tera Pellicane, MD

## 2013-12-24 NOTE — Assessment & Plan Note (Signed)
Poorly controlled. Previously on HCTZ which worked well. Normal K and Cr on BMET 08/2013. Occasional blurred vision. - Will start HCTZ 12.5mg  daily. - At f/u, check BMET. Pt will be back in town June 13 and should f/u near that time. - Call me with 3 BP values after you have been taking HCTZ

## 2013-12-24 NOTE — Assessment & Plan Note (Signed)
Appears to have spontaneously ruptured and is no longer oozing but has small amt of dried pus. Afebrile and no induration. - Continue warm compresses. - If fevers, larger boil, or increased firmness develop, have evaluated even if travling.

## 2014-05-22 IMAGING — CT CT ABD-PELV W/ CM
1 of 2 series · 15 of 32 positions shown, 19 images · IV contrast (OMNIPAQUE)
Comparison: PELVIC ULTRASOUND PERFORMED EARLIER TODAY

CLINICAL DATA: Right lower quadrant pain with recent partial
hysterectomy

EXAM:
CT ABDOMEN AND PELVIS WITH CONTRAST
TECHNIQUE: Multidetector CT imaging of the abdomen and pelvis was performed
using the standard protocol following bolus administration of
intravenous contrast.
CONTRAST:  100mL OMNIPAQUE IOHEXOL 300 MG/ML SOLN, 1 OMNIPAQUE
IOHEXOL 300 MG/ML SOLN

[Series 2: routine abdomen/pelvis with · axial · 0.59mm/px · z∈[+558,+923]mm · 15 of 81 slices shown, 19 images]
[im 4/81  soft-tissue]
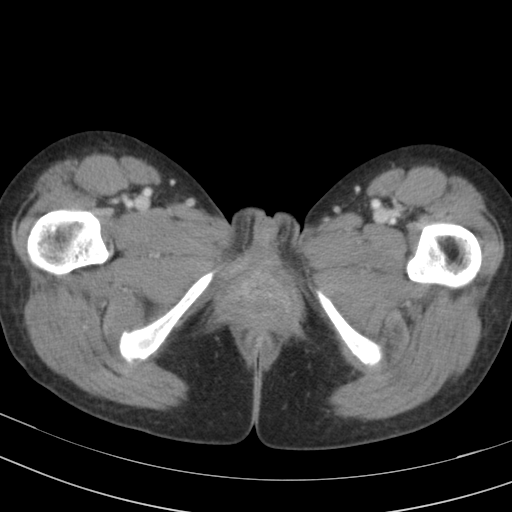
[im 4/81  bone]
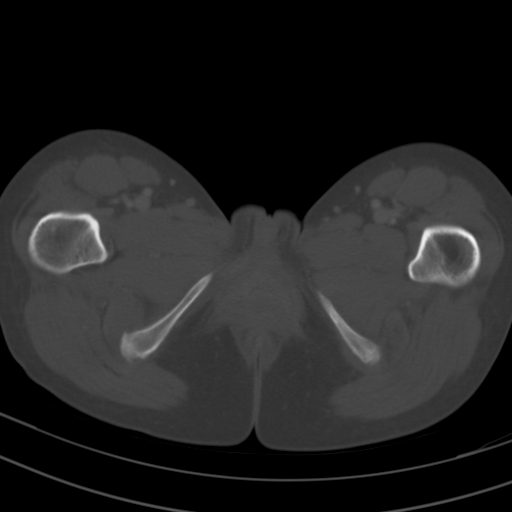
[im 11/81  soft-tissue]
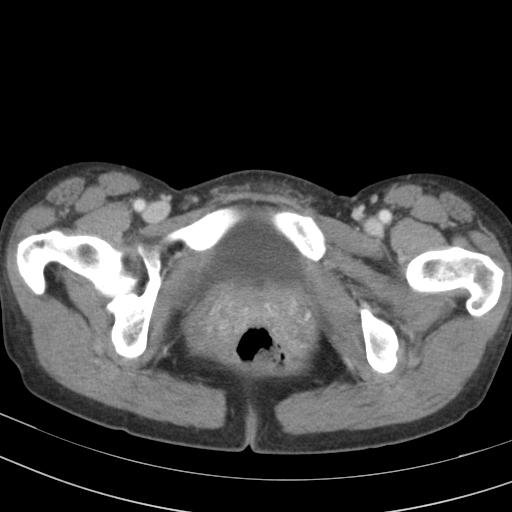
[im 18/81  soft-tissue]
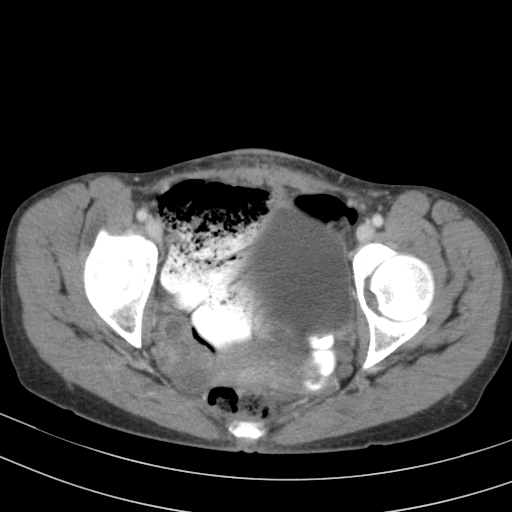
[im 21/81  soft-tissue]
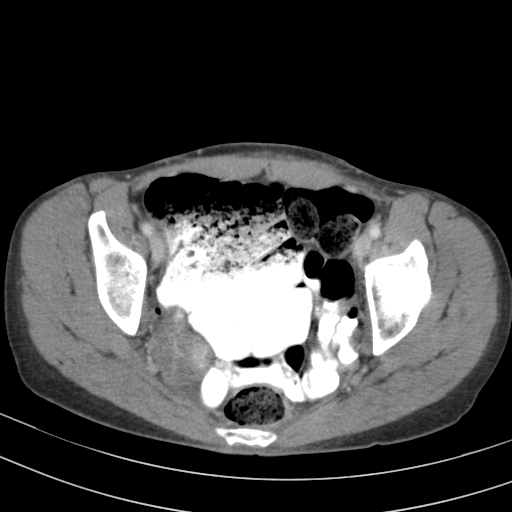
[im 28/81  soft-tissue]
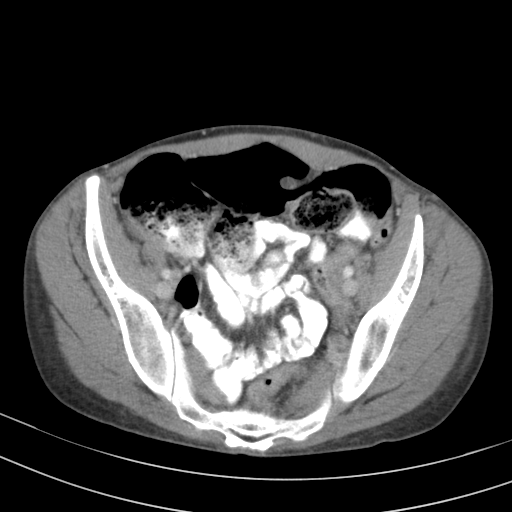
[im 35/81  soft-tissue]
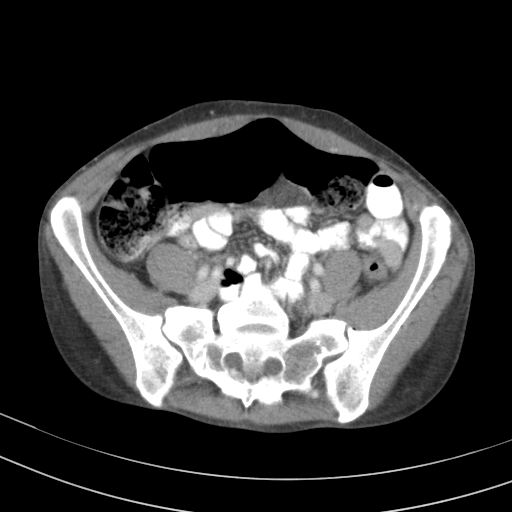
[im 42/81  soft-tissue]
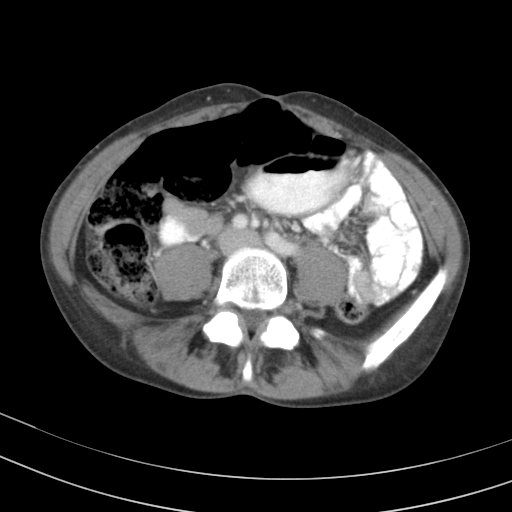
[im 46/81  soft-tissue]
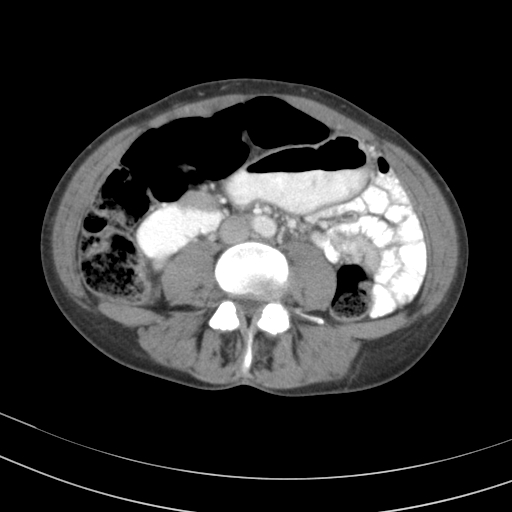
[im 53/81  soft-tissue]
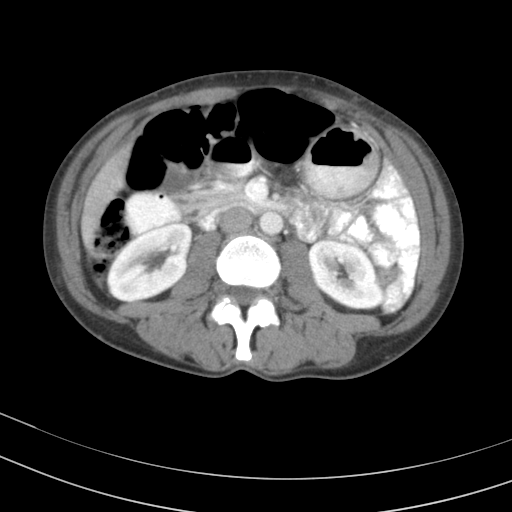
[im 53/81  bone]
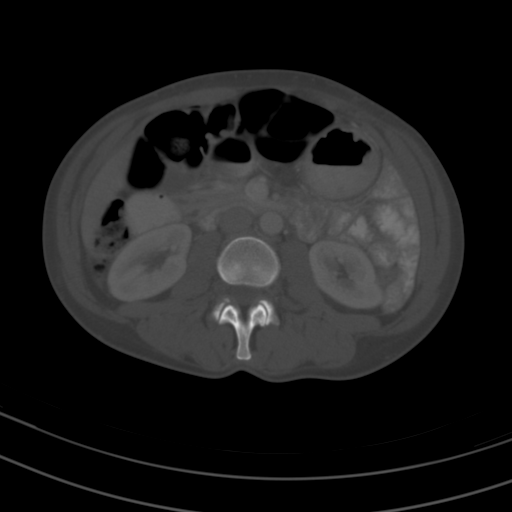
[im 60/81  soft-tissue]
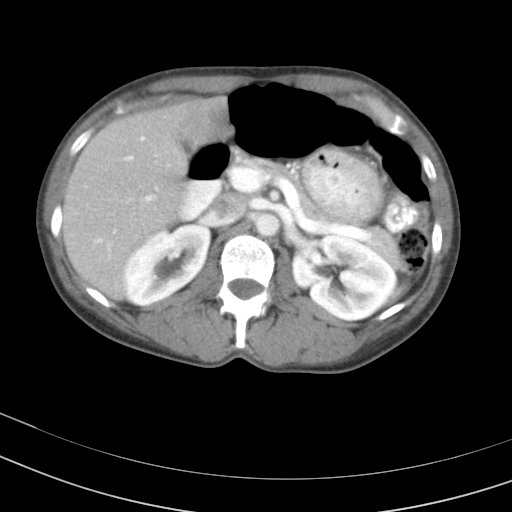
[im 63/81  soft-tissue]
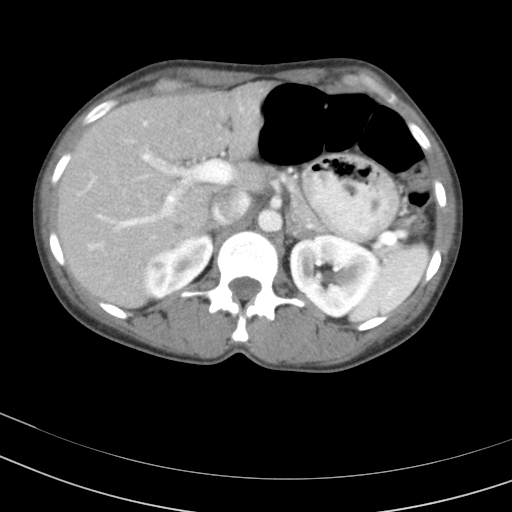
[im 67/81  lung]
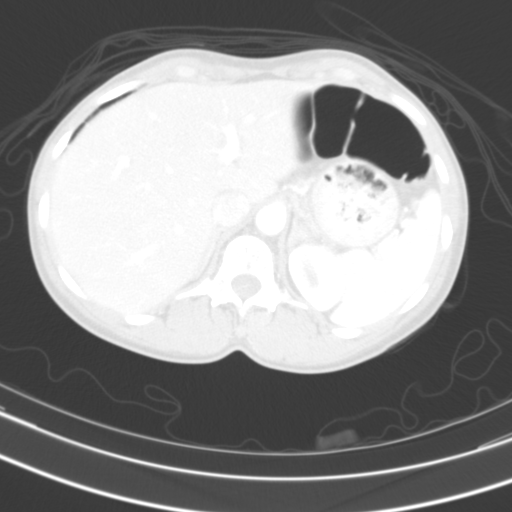
[im 70/81  soft-tissue]
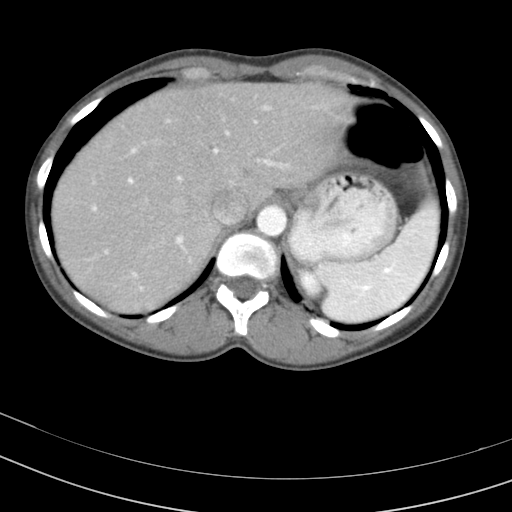
[im 70/81  lung]
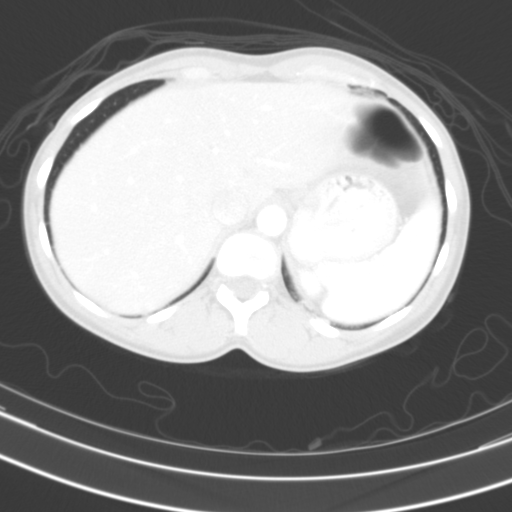
[im 74/81  lung]
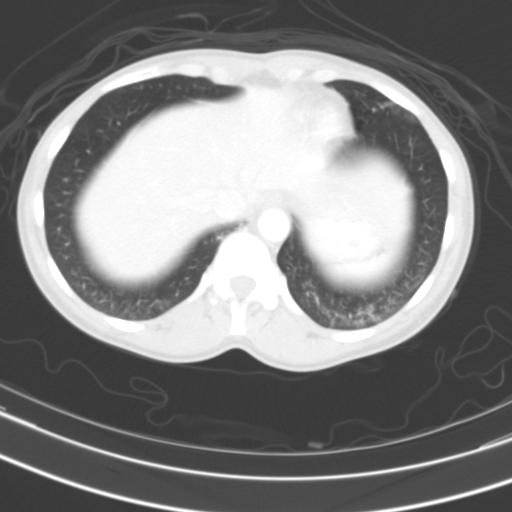
[im 77/81  soft-tissue]
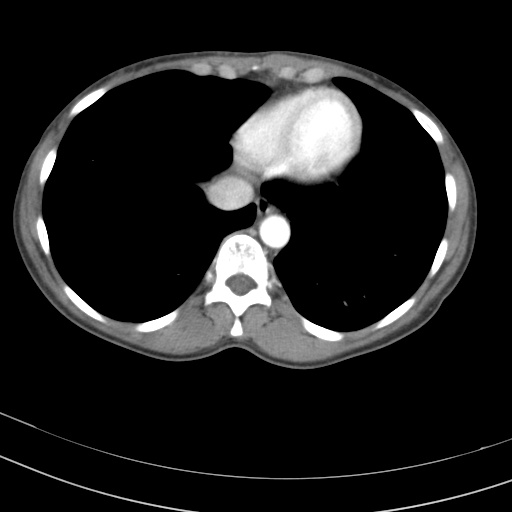
[im 77/81  lung]
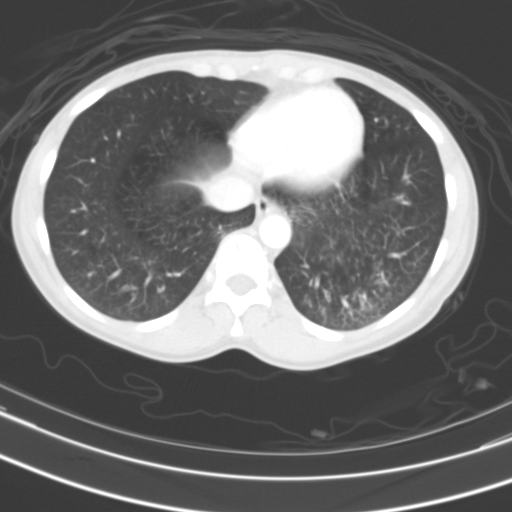

[15 of 32 positions shown; findings below may reference images not displayed]

FINDINGS: MILD INTERSTITIAL CHANGE LEFT LOWER LOBE.  RIGHT LUNG BASES CLEAR.

6 MM CYST POSTERIOR RIGHT LOBE OF THE LIVER. LIVER OTHERWISE NORMAL
EXCEPT FOR FATTY INFILTRATION. SPLEEN AND PANCREAS ARE NORMAL. MILD
PROMINENCE OF THE PANCREATIC DUCT WITH NO EVIDENCE OF PANCREATIC
MASS. ADRENAL GLANDS NORMAL. KIDNEYS NORMAL EXCEPT FOR 5 MM CYST
UPPER POLE RIGHT KIDNEY.

BOWEL IS NORMAL. AORTA IS NORMAL. BLADDER IS NORMAL. PATIENT IS
STATUS POST PARTIAL HYSTERECTOMY. BY REPORT LEFT OVARY IS SURGICALLY
ABSENT. THE RIGHT OVARY IS IDENTIFIED AND MEASURES 34 X 46 MM. IT
APPEARS MILDLY HETEROGENEOUS IN ATTENUATION WITH AREAS OF RELATIVELY
DECREASED ATTENUATION. THERE IS A SMALL VOLUME OF FREE FLUID
DIRECTLY POSTERIOR TO THE RIGHT OVARY. ULTRASOUND DID NOT CONFIRM
THE PRESENCE OF BLOOD FLOW TO THE RIGHT OVARY. THERE IS AT LEAST 1
FOLLICLE MEASURING 1 CM IDENTIFIED. A TINY FOCUS OF TUBULAR AIR IS
SEEN JUST ANTERIOR TO THE OVARY WHICH MAY REPRESENT APPENDIX.
IMPRESSION: AS SEEN ON ULTRASOUND PERFORMED EARLIER THIS EVENING, THE RIGHT
OVARY IS MILDLY ENLARGED WITH AN EDEMATOUS APPEARANCE THAN A SMALL
VOLUME OF FREE FLUID POSTERIOR TO IT. THE APPEARANCE IS ABNORMAL BUT
NONSPECIFIC. POSTSURGICAL CHANGE COULD BE CONSIDERED. BASED UPON
SONOGRAPHIC FINDINGS, THE POSSIBILITY OF TORSION IS NOT EXCLUDED.

## 2014-06-20 ENCOUNTER — Encounter: Payer: Self-pay | Admitting: Family Medicine

## 2016-07-05 ENCOUNTER — Emergency Department (HOSPITAL_BASED_OUTPATIENT_CLINIC_OR_DEPARTMENT_OTHER): Payer: Self-pay

## 2016-07-05 ENCOUNTER — Encounter (HOSPITAL_BASED_OUTPATIENT_CLINIC_OR_DEPARTMENT_OTHER): Payer: Self-pay

## 2016-07-05 ENCOUNTER — Emergency Department (HOSPITAL_BASED_OUTPATIENT_CLINIC_OR_DEPARTMENT_OTHER)
Admission: EM | Admit: 2016-07-05 | Discharge: 2016-07-05 | Disposition: A | Discharge status: Home / Self Care | Payer: Self-pay | Attending: Emergency Medicine | Admitting: Emergency Medicine

## 2016-07-05 DIAGNOSIS — Z87891 Personal history of nicotine dependence: Secondary | ICD-10-CM | POA: Insufficient documentation

## 2016-07-05 DIAGNOSIS — X58XXXA Exposure to other specified factors, initial encounter: Secondary | ICD-10-CM | POA: Insufficient documentation

## 2016-07-05 DIAGNOSIS — J45909 Unspecified asthma, uncomplicated: Secondary | ICD-10-CM | POA: Insufficient documentation

## 2016-07-05 DIAGNOSIS — S39012A Strain of muscle, fascia and tendon of lower back, initial encounter: Secondary | ICD-10-CM | POA: Insufficient documentation

## 2016-07-05 DIAGNOSIS — Y929 Unspecified place or not applicable: Secondary | ICD-10-CM | POA: Insufficient documentation

## 2016-07-05 DIAGNOSIS — Y939 Activity, unspecified: Secondary | ICD-10-CM | POA: Insufficient documentation

## 2016-07-05 DIAGNOSIS — Y999 Unspecified external cause status: Secondary | ICD-10-CM | POA: Insufficient documentation

## 2016-07-05 DIAGNOSIS — I10 Essential (primary) hypertension: Secondary | ICD-10-CM | POA: Insufficient documentation

## 2016-07-05 LAB — URINALYSIS, ROUTINE W REFLEX MICROSCOPIC
Bilirubin Urine: NEGATIVE
GLUCOSE, UA: NEGATIVE mg/dL
HGB URINE DIPSTICK: NEGATIVE
KETONES UR: NEGATIVE mg/dL
Nitrite: NEGATIVE
Protein, ur: NEGATIVE mg/dL
Specific Gravity, Urine: 1.01 (ref 1.005–1.030)
pH: 6.5 (ref 5.0–8.0)

## 2016-07-05 LAB — URINE MICROSCOPIC-ADD ON

## 2016-07-05 MED ORDER — KETOROLAC TROMETHAMINE 60 MG/2ML IM SOLN
60.0000 mg | Freq: Once | INTRAMUSCULAR | Status: AC
  Administered 2016-07-05: 60 mg via INTRAMUSCULAR
  Filled 2016-07-05: qty 2

## 2016-07-05 MED ORDER — METHOCARBAMOL 500 MG PO TABS
1000.0000 mg | ORAL_TABLET | Freq: Once | ORAL | Status: AC
  Administered 2016-07-05: 1000 mg via ORAL
  Filled 2016-07-05: qty 2

## 2016-07-05 MED ORDER — NAPROXEN 375 MG PO TABS
375.0000 mg | ORAL_TABLET | Freq: Two times a day (BID) | ORAL | 0 refills | Status: DC
Start: 2016-07-05 — End: 2016-08-12

## 2016-07-05 MED ORDER — METHOCARBAMOL 500 MG PO TABS: 500.0000 mg | ORAL_TABLET | Freq: Two times a day (BID) | ORAL | 0 refills | Status: DC

## 2016-07-05 NOTE — ED Notes (Signed)
Patient transported to X-ray 

## 2016-07-05 NOTE — ED Triage Notes (Signed)
Pt c/o lower back pain radiating down lt leg with numbess to toes; denies injury

## 2016-07-05 NOTE — ED Provider Notes (Signed)
[facility]   CSN: [id number] Arrival date & time: 07/05/16  0103     History   Chief Complaint Chief Complaint  Patient presents with  . Back Pain    HPI Connie Black is a 50 y.o. female.  The history is provided by the patient.  Back Pain   This is a new problem. The current episode started more than 1 week ago. The problem occurs constantly. The problem has not changed since onset.The pain is associated with no known injury. The pain is present in the lumbar spine and gluteal region. The quality of the pain is described as stabbing. The pain is severe. The symptoms are aggravated by certain positions. The pain is the same all the time. Pertinent negatives include no chest pain, no fever, no numbness, no weight loss, no headaches, no abdominal pain, no abdominal swelling, no bowel incontinence, no perianal numbness, no bladder incontinence, no dysuria, no pelvic pain, no leg pain, no paresthesias, no paresis, no tingling and no weakness. Treatments tried: tylenol. The treatment provided no relief. Risk factors: none.    Past Medical History:  Diagnosis Date  . Anxiety   . Asthma   . Bipolar 1 disorder (HCC)   . Eating disorder   . Hypertension   . Lumbar radiculopathy, chronic   . Migraines     Patient Active Problem List   Diagnosis Date Noted  . S/P hysterectomy 12/24/2013  . Cellulitis and abscess of buttock 12/24/2013  . Ovarian torsion 08/21/2013  . Back pain 06/14/2011  . Anorexia 06/14/2011  . ASTHMA, INTERMITTENT 04/21/2010  . MIGRAINE HEADACHE 09/06/2009  . HERNIATED LUMBOSACRAL DISC 09/06/2009  . SUBSTANCE ABUSE 05/26/2008  . BIPOLAR DISORDER 10/16/2006  . TOBACCO DEPENDENCE 10/16/2006  . HYPERTENSION, BENIGN SYSTEMIC 10/16/2006    Past Surgical History:  Procedure Laterality Date  . "Release fluid from Brain" for Migraines June 2013    . ABDOMINAL HYSTERECTOMY    . LAPAROSCOPY Right 08/20/2013   Procedure: LAPAROSCOPY  OPERATIVE;  Surgeon: Catalina AntiguaPeggy Constant, MD;  Location: [facility];  Service: Gynecology;  Laterality: Right;    OB History    Gravida Para Term Preterm AB Living   3 3 1 2        SAB TAB Ectopic Multiple Live Births                   Home Medications    Prior to Admission medications   Medication Sig Start Date End Date Taking? Authorizing Provider  metoCLOPramide (REGLAN) 10 MG tablet Take 10 mg by mouth as needed for nausea (for Headache).    Historical Provider, MD  QUEtiapine (SEROQUEL) 300 MG tablet Take 600 mg by mouth at bedtime.    Historical Provider, MD    Family History Family History  Problem Relation Age of Onset  . Cancer Other     Social History Social History  Substance Use Topics  . Smoking status: Former Smoker    Packs/day: 0.50    Quit date: 08/09/2013  . Smokeless tobacco: Not on file     Comment: is trying to quitt  . Alcohol use Yes     Comment: occ     Allergies   Morphine   Review of Systems Review of Systems  Constitutional: Negative for fever and weight loss.  Cardiovascular: Negative for chest pain.  Gastrointestinal: Negative for abdominal pain and bowel incontinence.  Genitourinary: Negative for bladder incontinence, difficulty urinating, dysuria and pelvic pain.  Musculoskeletal: Positive  for back pain. Negative for gait problem, neck pain and neck stiffness.  Neurological: Negative for dizziness, tingling, tremors, seizures, facial asymmetry, speech difficulty, weakness, light-headedness, numbness, headaches and paresthesias.  Hematological: Negative for adenopathy.  All other systems reviewed and are negative.    Physical Exam Updated Vital Signs BP 137/87 (BP Location: Right Arm)   Pulse 86   Temp 97.9 F (36.6 C) (Oral)   Resp 18   Ht 5\' 7"  (1.702 m)   Wt 112 lb (50.8 kg)   LMP 11/03/2011   SpO2 97%   BMI 17.54 kg/m   Physical Exam  Constitutional: She appears well-developed and well-nourished. No distress.  HENT:    Head: Normocephalic and atraumatic.  Mouth/Throat: No oropharyngeal exudate.  Eyes: Conjunctivae and EOM are normal. Pupils are equal, round, and reactive to light.  Neck: Normal range of motion. Neck supple. No JVD present.  Cardiovascular: Normal rate, regular rhythm, normal heart sounds and intact distal pulses.   Pulmonary/Chest: Effort normal and breath sounds normal. No stridor. She has no wheezes. She has no rales.  Abdominal: Soft. Bowel sounds are normal. She exhibits no mass. There is no tenderness. There is no rebound and no guarding.  Musculoskeletal: Normal range of motion. She exhibits no edema or tenderness.       Left hip: Normal.       Left knee: Normal.       Left ankle: Normal.       Cervical back: Normal.       Thoracic back: Normal.       Lumbar back: Normal.       Left foot: Normal.  2+ dorsalis pedis sensation is intact throughout the leg L5s1 intact  Neurological: She is alert. She displays normal reflexes. No sensory deficit. She exhibits normal muscle tone.  Skin: Capillary refill takes less than 2 seconds.  Psychiatric: She has a normal mood and affect.     ED Treatments / Results   Vitals:   07/05/16 0113  BP: 137/87  Pulse: 86  Resp: 18  Temp: 97.9 F (36.6 C)   Results for orders placed or performed during the hospital encounter of 07/05/16  Urinalysis, Routine w reflex microscopic (not at [facility])  Result Value Ref Range   Color, Urine YELLOW YELLOW   APPearance CLEAR CLEAR   Specific Gravity, Urine 1.010 1.005 - 1.030   pH 6.5 5.0 - 8.0   Glucose, UA NEGATIVE NEGATIVE mg/dL   Hgb urine dipstick NEGATIVE NEGATIVE   Bilirubin Urine NEGATIVE NEGATIVE   Ketones, ur NEGATIVE NEGATIVE mg/dL   Protein, ur NEGATIVE NEGATIVE mg/dL   Nitrite NEGATIVE NEGATIVE   Leukocytes, UA SMALL (A) NEGATIVE  Urine microscopic-add on  Result Value Ref Range   Squamous Epithelial / LPF 0-5 (A) NONE SEEN   WBC, UA 0-5 0 - 5 WBC/hpf   RBC / HPF 0-5 0 - 5 RBC/hpf    Bacteria, UA FEW (A) NONE SEEN   Dg Lumbar Spine Complete  Result Date: 07/05/2016 CLINICAL DATA:  Initial evaluation for low back pain with left-sided numbness. No known injury. EXAM: LUMBAR SPINE - COMPLETE 4+ VIEW COMPARISON:  None. FINDINGS: Transitional lumbosacral anatomy with 6 non-rib-bearing vertebral bodies present. Vertebral bodies normally aligned with preservation of the normal lumbar lordosis. Vertebral body heights preserved. No acute fracture subluxation. No significant degenerative changes within the lumbar spine. SI joints approximated and symmetric. No soft tissue abnormality. IMPRESSION: 1. No acute osseous abnormality within the lumbar spine. 2. No  significant degenerative changes identified. 3. Transitional lumbosacral anatomy. Electronically Signed   By: Rise MuBenjamin  McClintock M.D.   On: 07/05/2016 02:37    Procedures Procedures (including critical care time)  Medications Ordered in ED Medications  ketorolac (TORADOL) injection 60 mg (not administered)  methocarbamol (ROBAXIN) tablet 1,000 mg (not administered)      Final Clinical Impressions(s) / ED Diagnoses  Lumbar strain:  Intact gait, intact perineal sensation and sensation to the BLE.  Stable for discharge.  All questions answered to patient's satisfaction. Based on history and exam patient has been appropriately medically screened and emergency conditions excluded. Patient is stable for discharge at this time. Follow up with your PMD for recheck in 2 days and strict return precautions given.  New Prescriptions New Prescriptions   No medications on file     Abir Eroh, MD 07/05/16 [phone]

## 2016-08-12 ENCOUNTER — Emergency Department (HOSPITAL_BASED_OUTPATIENT_CLINIC_OR_DEPARTMENT_OTHER)
Admission: EM | Admit: 2016-08-12 | Discharge: 2016-08-12 | Disposition: A | Discharge status: Home / Self Care | Payer: Self-pay | Attending: Emergency Medicine | Admitting: Emergency Medicine

## 2016-08-12 ENCOUNTER — Encounter (HOSPITAL_BASED_OUTPATIENT_CLINIC_OR_DEPARTMENT_OTHER): Payer: Self-pay | Admitting: *Deleted

## 2016-08-12 DIAGNOSIS — Y929 Unspecified place or not applicable: Secondary | ICD-10-CM | POA: Insufficient documentation

## 2016-08-12 DIAGNOSIS — T162XXA Foreign body in left ear, initial encounter: Secondary | ICD-10-CM | POA: Insufficient documentation

## 2016-08-12 DIAGNOSIS — X58XXXA Exposure to other specified factors, initial encounter: Secondary | ICD-10-CM | POA: Insufficient documentation

## 2016-08-12 DIAGNOSIS — Z79899 Other long term (current) drug therapy: Secondary | ICD-10-CM | POA: Insufficient documentation

## 2016-08-12 DIAGNOSIS — J45909 Unspecified asthma, uncomplicated: Secondary | ICD-10-CM | POA: Insufficient documentation

## 2016-08-12 DIAGNOSIS — Y999 Unspecified external cause status: Secondary | ICD-10-CM | POA: Insufficient documentation

## 2016-08-12 DIAGNOSIS — F17200 Nicotine dependence, unspecified, uncomplicated: Secondary | ICD-10-CM | POA: Insufficient documentation

## 2016-08-12 DIAGNOSIS — I10 Essential (primary) hypertension: Secondary | ICD-10-CM | POA: Insufficient documentation

## 2016-08-12 DIAGNOSIS — Y939 Activity, unspecified: Secondary | ICD-10-CM | POA: Insufficient documentation

## 2016-08-12 DIAGNOSIS — S00452A Superficial foreign body of left ear, initial encounter: Secondary | ICD-10-CM

## 2016-08-12 MED ORDER — LIDOCAINE HCL 2 % IJ SOLN
10.0000 mL | Freq: Once | INTRAMUSCULAR | Status: AC
  Administered 2016-08-12: 200 mg
  Filled 2016-08-12: qty 20

## 2016-08-12 MED ORDER — NAPROXEN 375 MG PO TABS: 375.0000 mg | ORAL_TABLET | Freq: Two times a day (BID) | ORAL | 0 refills | Status: DC

## 2016-08-12 MED ORDER — CEPHALEXIN 500 MG PO CAPS: 500.0000 mg | ORAL_CAPSULE | Freq: Four times a day (QID) | ORAL | 0 refills | Status: DC

## 2016-08-12 NOTE — Discharge Instructions (Signed)
°  Contact a health care provider if: You have a headache. Your bleeding will not stop You have a fever. You have increased pain or swelling of your ear. Your hearing is reduced. You have discharge coming from your ear.

## 2016-08-12 NOTE — ED Triage Notes (Signed)
Patient c/o pain to outer ear due to infection from a pierced earring. Swollen,tender to touch

## 2016-08-12 NOTE — ED Provider Notes (Addendum)
[facility]   CSN: [id number] Arrival date & time: 08/12/16  [id number]     History   Chief Complaint Chief Complaint  Patient presents with  . Foreign Body in Ear    HPI Connie Black is a 50 y.o. female who presents emergency Department with chief complaint of retained urinating. Patient had her left tragus pierced 6 days ago. Last night she began having tenderness and swelling. She is unable to remove the earring had this time and has the front portion embedded in the tissue. She has not taken anything prior to arrival for pain. She denies any drainage from the site, fevers, chills.  HPI  Past Medical History:  Diagnosis Date  . Anxiety   . Asthma   . Bipolar 1 disorder (HCC)   . Eating disorder   . Hypertension   . Lumbar radiculopathy, chronic   . Migraines     Patient Active Problem List   Diagnosis Date Noted  . S/P hysterectomy 12/24/2013  . Cellulitis and abscess of buttock 12/24/2013  . Ovarian torsion 08/21/2013  . Back pain 06/14/2011  . Anorexia 06/14/2011  . ASTHMA, INTERMITTENT 04/21/2010  . MIGRAINE HEADACHE 09/06/2009  . HERNIATED LUMBOSACRAL DISC 09/06/2009  . SUBSTANCE ABUSE 05/26/2008  . BIPOLAR DISORDER 10/16/2006  . TOBACCO DEPENDENCE 10/16/2006  . HYPERTENSION, BENIGN SYSTEMIC 10/16/2006    Past Surgical History:  Procedure Laterality Date  . "Release fluid from Brain" for Migraines June 2013    . ABDOMINAL HYSTERECTOMY    . LAPAROSCOPY Right 08/20/2013   Procedure: LAPAROSCOPY OPERATIVE;  Surgeon: Catalina AntiguaPeggy Constant, MD;  Location: [facility];  Service: Gynecology;  Laterality: Right;    OB History    Gravida Para Term Preterm AB Living   3 3 1 2        SAB TAB Ectopic Multiple Live Births                   Home Medications    Prior to Admission medications   Medication Sig Start Date End Date Taking? Authorizing Provider  NORTRIPTYLINE HCL PO Take by mouth.   Yes Historical Provider, MD  methocarbamol (ROBAXIN)  500 MG tablet Take 1 tablet (500 mg total) by mouth 2 (two) times daily. 07/05/16   April Palumbo, MD  metoCLOPramide (REGLAN) 10 MG tablet Take 10 mg by mouth as needed for nausea (for Headache).    Historical Provider, MD  naproxen (NAPROSYN) 375 MG tablet Take 1 tablet (375 mg total) by mouth 2 (two) times daily. 07/05/16   April Palumbo, MD  QUEtiapine (SEROQUEL) 300 MG tablet Take 600 mg by mouth at bedtime.    Historical Provider, MD    Family History Family History  Problem Relation Age of Onset  . Cancer Other     Social History Social History  Substance Use Topics  . Smoking status: Current Every Day Smoker    Packs/day: 0.50    Last attempt to quit: 08/09/2013  . Smokeless tobacco: Never Used     Comment: is trying to quitt  . Alcohol use Yes     Comment: occ     Allergies   Morphine   Review of Systems Review of Systems  Constitutional: Negative for chills and fever.  HENT: Positive for ear pain.   Ten systems reviewed and are negative for acute change, except as noted in the HPI.     Physical Exam Updated Vital Signs BP 192/98 (BP Location: Left Arm)   Pulse 73  Temp 98.3 F (36.8 C) (Oral)   Resp 16   LMP 11/03/2011   SpO2 100%   Physical Exam  Constitutional: She is oriented to person, place, and time. She appears well-developed and well-nourished. No distress.  HENT:  Head: Normocephalic and atraumatic.  Earring embedded in the left Tragus with swelling, redness, and pain  Eyes: Conjunctivae and EOM are normal. Pupils are equal, round, and reactive to light. No scleral icterus.  Neck: Normal range of motion.  Cardiovascular: Normal rate, regular rhythm and normal heart sounds.  Exam reveals no gallop and no friction rub.   No murmur heard. Pulmonary/Chest: Effort normal and breath sounds normal. No respiratory distress.  Abdominal: Soft. Bowel sounds are normal. She exhibits no distension and no mass. There is no tenderness. There is no  guarding.  Neurological: She is alert and oriented to person, place, and time.  Skin: Skin is warm and dry. She is not diaphoretic.     ED Treatments / Results  Labs (all labs ordered are listed, but only abnormal results are displayed) Labs Reviewed - No data to display  EKG  EKG Interpretation None       Radiology No results found.  Procedures .Foreign Body Removal Date/Time: 08/12/2016 4:08 PM Performed by: Arthor CaptainHARRIS, Lue [place] Authorized by: Arthor CaptainHARRIS, Sibley Rolison  Consent: Verbal consent obtained. Risks and benefits: risks, benefits and alternatives were discussed Consent given by: patient Patient identity confirmed: verbally with patient Time out: Immediately prior to procedure a "time out" was called to verify the correct patient, procedure, equipment, support staff and site/side marked as required. Body area: ear Anesthesia: local infiltration  Anesthesia: Local Anesthetic: lidocaine 2% without epinephrine Patient cooperative: yes Localization method: probed and visualized Removal mechanism: forceps Complexity: complex 1 objects recovered. Objects recovered: earring stud Post-procedure assessment: foreign body removed Patient tolerance: Patient tolerated the procedure well with no immediate complications Comments: 1 earring stud embedded in the left tragus removed with forceps   (including critical care time)  Medications Ordered in ED Medications  lidocaine (XYLOCAINE) 2 % (with pres) injection 200 mg (not administered)     Initial Impression / Assessment and Plan / ED Course  I have reviewed the triage vital signs and the nursing notes.  Pertinent labs & imaging results that were available during my care of the patient were reviewed by me and considered in my medical decision making (see chart for details).  Clinical Course     Patient with Earring embedded in the left tragus. Auricular block applied and removed the earring easily with forceps. Patient  given antibiotics at discharge for some infection  Final Clinical Impressions(s) / ED Diagnoses   Final diagnoses:  Embedded earring of left ear, initial encounter  Hypertension, unspecified type    New Prescriptions New Prescriptions   No medications on file     Arthor Captainbigail Courtney Bellizzi, PA-C 08/12/16 1613    Alvira MondayErin Schlossman, MD 08/12/16 2352    Arthor CaptainAbigail Asha Grumbine, PA-C 08/27/16 2329    Alvira MondayErin Schlossman, MD 08/28/16 1250

## 2016-09-06 ENCOUNTER — Emergency Department (HOSPITAL_COMMUNITY)
Admission: EM | Admit: 2016-09-06 | Discharge: 2016-09-06 | Disposition: A | Discharge status: Home / Self Care | Payer: Self-pay | Attending: Physician Assistant | Admitting: Physician Assistant

## 2016-09-06 ENCOUNTER — Encounter (HOSPITAL_COMMUNITY): Payer: Self-pay | Admitting: *Deleted

## 2016-09-06 DIAGNOSIS — M5442 Lumbago with sciatica, left side: Secondary | ICD-10-CM | POA: Insufficient documentation

## 2016-09-06 DIAGNOSIS — J45909 Unspecified asthma, uncomplicated: Secondary | ICD-10-CM | POA: Insufficient documentation

## 2016-09-06 DIAGNOSIS — F17200 Nicotine dependence, unspecified, uncomplicated: Secondary | ICD-10-CM | POA: Insufficient documentation

## 2016-09-06 DIAGNOSIS — I10 Essential (primary) hypertension: Secondary | ICD-10-CM | POA: Insufficient documentation

## 2016-09-06 MED ORDER — METHOCARBAMOL 500 MG PO TABS: 500.0000 mg | ORAL_TABLET | Freq: Two times a day (BID) | ORAL | 0 refills | Status: DC

## 2016-09-06 MED ORDER — TRAMADOL HCL 50 MG PO TABS: 50.0000 mg | ORAL_TABLET | Freq: Four times a day (QID) | ORAL | 0 refills | Status: DC | PRN

## 2016-09-06 MED ORDER — PREDNISONE 20 MG PO TABS: ORAL_TABLET | ORAL | 0 refills | Status: DC

## 2016-09-06 NOTE — ED Triage Notes (Signed)
Pt in c/o L lower back pain chronic that radiates into L leg, denies injury to the area, ambulatory with pain, MAE, A&O x4

## 2016-09-06 NOTE — ED Provider Notes (Signed)
[facility]-EMERGENCY DEPT Provider Note     By signing my name below, I, Connie Black, attest that this documentation has been prepared under the direction and in the presence of Connie Helper, PA-C. Electronically Signed: Earmon Black, ED Scribe. 09/06/16. 11:33 AM.    History   Chief Complaint Chief Complaint  Patient presents with  . Back Pain   The history is provided by the patient and medical records. No language interpreter was used.    Connie Black is a 51 y.o. female with PMHx of chronic lumbar radiculopathy who presents to the Emergency Department complaining of left lower back pain that radiates down the left thigh that began about one week ago. She reports associated left calf and thigh stiffness. She states she was seen in the ED approximately one week ago and was prescribed muscle relaxer and NSAID which she has taken as directed that helped for a short while but the pain has since returned and she is now out. Movements and walking increase her pain. Pt denies alleviating factors. She denies bowel or bladder incontinence, numbness, tingling or weakness of the lower extremities, saddle anesthesia, fever, chills, nausea, vomiting, bruising or wounds. She denies trauma, injury or fall. She reports having surgery to her lumbar spine 4-5 years ago. She denies h/o cancer or IV drug use. She does not currently have an orthopedist and just recently moved back here from [place].   Past Medical History:  Diagnosis Date  . Anxiety   . Asthma   . Bipolar 1 disorder (HCC)   . Eating disorder   . Hypertension   . Lumbar radiculopathy, chronic   . Migraines     Patient Active Problem List   Diagnosis Date Noted  . S/P hysterectomy 12/24/2013  . Cellulitis and abscess of buttock 12/24/2013  . Ovarian torsion 08/21/2013  . Back pain 06/14/2011  . Anorexia 06/14/2011  . ASTHMA, INTERMITTENT 04/21/2010  . MIGRAINE HEADACHE 09/06/2009  . HERNIATED LUMBOSACRAL DISC 09/06/2009    . SUBSTANCE ABUSE 05/26/2008  . BIPOLAR DISORDER 10/16/2006  . TOBACCO DEPENDENCE 10/16/2006  . HYPERTENSION, BENIGN SYSTEMIC 10/16/2006    Past Surgical History:  Procedure Laterality Date  . "Release fluid from Brain" for Migraines June 2013    . ABDOMINAL HYSTERECTOMY    . LAPAROSCOPY Right 08/20/2013   Procedure: LAPAROSCOPY OPERATIVE;  Surgeon: Connie Antigua, MD;  Location: [facility];  Service: Gynecology;  Laterality: Right;    OB History    Gravida Para Term Preterm AB Living   3 3 1 2        SAB TAB Ectopic Multiple Live Births                   Home Medications    Prior to Admission medications   Medication Sig Start Date End Date Taking? Authorizing Provider  cephALEXin (KEFLEX) 500 MG capsule Take 1 capsule (500 mg total) by mouth 4 (four) times daily. 08/12/16   Arthor Captain, PA-C  methocarbamol (ROBAXIN) 500 MG tablet Take 1 tablet (500 mg total) by mouth 2 (two) times daily. 09/06/16   Connie Helper, PA-C  metoCLOPramide (REGLAN) 10 MG tablet Take 10 mg by mouth as needed for nausea (for Headache).    Historical Provider, MD  naproxen (NAPROSYN) 375 MG tablet Take 1 tablet (375 mg total) by mouth 2 (two) times daily. 08/12/16   Abigail Harris, PA-C  NORTRIPTYLINE HCL PO Take by mouth.    Historical Provider, MD  predniSONE (DELTASONE) 20 MG tablet 2  tabs po daily x 4 days 09/06/16   Connie Helper, PA-C  QUEtiapine (SEROQUEL) 300 MG tablet Take 600 mg by mouth at bedtime.    Historical Provider, MD  traMADol (ULTRAM) 50 MG tablet Take 1 tablet (50 mg total) by mouth every 6 (six) hours as needed for moderate pain or severe pain. 09/06/16   Connie Helper, PA-C    Family History Family History  Problem Relation Age of Onset  . Cancer Other     Social History Social History  Substance Use Topics  . Smoking status: Current Every Day Smoker    Packs/day: 0.50    Last attempt to quit: 08/09/2013  . Smokeless tobacco: Never Used     Comment: is trying to quitt  . Alcohol  use Yes     Comment: occ     Allergies   Morphine   Review of Systems Review of Systems  Constitutional: Negative for chills and fever.  Gastrointestinal: Negative for nausea and vomiting.       No bowel or bladder incontinence  Musculoskeletal: Positive for back pain and myalgias.  Skin: Negative for color change and wound.  Neurological: Negative for weakness and numbness.     Physical Exam Updated Vital Signs BP (!) 180/106 (BP Location: Right Arm)   Pulse 87   Temp 97.8 F (36.6 C) (Oral)   Resp 18   Ht 5\' 7"  (1.702 m)   Wt 86 lb 4 oz (39.1 kg)   LMP 11/03/2011   SpO2 100%   BMI 13.51 kg/m   Physical Exam  Constitutional: She is oriented to person, place, and time. No distress.  Malnourished appearing female in no acute distress.  HENT:  Head: Normocephalic and atraumatic.  Neck: Normal range of motion.  Cardiovascular: Normal rate.   Pulmonary/Chest: Effort normal.  Musculoskeletal: Normal range of motion. She exhibits tenderness. She exhibits no edema or deformity.  Tenderness to palpation over lumbar and left lumbar paraspinal muscles. Tenderness to left lumbosacral region. Positive left SLR.  Neurological: She is alert and oriented to person, place, and time.  Patella DTRs intact bilaterally. No foot drag.  Skin: Skin is warm and dry.  Psychiatric: She has a normal mood and affect. Her behavior is normal.  Nursing note and vitals reviewed.    ED Treatments / Results  DIAGNOSTIC STUDIES: Oxygen Saturation is 100% on RA, normal by my interpretation.   COORDINATION OF CARE: 10:51 AM- Will prescribe Prednisone, Robaxin and Tramadol. Will give referral to orthopedist. Pt verbalizes understanding and agrees to plan.  Medications - No data to display  Labs (all labs ordered are listed, but only abnormal results are displayed) Labs Reviewed - No data to display  EKG  EKG Interpretation None       Radiology No results  found.  Procedures Procedures (including critical care time)  Medications Ordered in ED Medications - No data to display   Initial Impression / Assessment and Plan / ED Course  I have reviewed the triage vital signs and the nursing notes.  Pertinent labs & imaging results that were available during my care of the patient were reviewed by me and considered in my medical decision making (see chart for details).     BP (!) 180/106 (BP Location: Right Arm)   Pulse 87   Temp 97.8 F (36.6 C) (Oral)   Resp 18   Ht 5\' 7"  (1.702 m)   Wt 39.1 kg   LMP 11/03/2011   SpO2 100%  BMI 13.51 kg/m  The patient was noted to be hypertensive today in the emergency department. I have spoken with the patient regarding hypertension and the need for improved management. I instructed the patient to followup with the Primary care doctor within 4 days to improve the management of the patient's hypertension. I also counseled the patient regarding the signs and symptoms which would require an emergent visit to an emergency department for hypertensive urgency and/or hypertensive emergency. The patient understood the need for improved hypertensive management.  I personally performed the services described in this documentation, which was scribed in my presence. The recorded information has been reviewed and is accurate.     Final Clinical Impressions(s) / ED Diagnoses   Final diagnoses:  Acute back pain with sciatica, left    New Prescriptions Discharge Medication List as of 09/06/2016 10:52 AM    START taking these medications   Details  predniSONE (DELTASONE) 20 MG tablet 2 tabs po daily x 4 days, Print    traMADol (ULTRAM) 50 MG tablet Take 1 tablet (50 mg total) by mouth every 6 (six) hours as needed for moderate pain or severe pain., Starting Fri 09/06/2016, Print         Connie HelperBowie Mionna Advincula, PA-C 09/06/16 1625    Courteney Randall AnLyn Mackuen, MD 09/09/16 [id number]

## 2016-09-17 ENCOUNTER — Telehealth (INDEPENDENT_AMBULATORY_CARE_PROVIDER_SITE_OTHER): Payer: Self-pay | Admitting: Orthopaedic Surgery

## 2016-09-17 NOTE — Telephone Encounter (Signed)
Patient scheduled 09/24/16 at 9:30am with Dr Roda ShuttersXu

## 2016-09-24 ENCOUNTER — Encounter (INDEPENDENT_AMBULATORY_CARE_PROVIDER_SITE_OTHER): Payer: Self-pay | Admitting: Orthopaedic Surgery

## 2016-09-24 ENCOUNTER — Ambulatory Visit (INDEPENDENT_AMBULATORY_CARE_PROVIDER_SITE_OTHER): Payer: Self-pay | Admitting: Orthopaedic Surgery

## 2016-09-24 DIAGNOSIS — M5416 Radiculopathy, lumbar region: Secondary | ICD-10-CM

## 2016-09-24 MED ORDER — DICLOFENAC SODIUM 75 MG PO TBEC: 75.0000 mg | DELAYED_RELEASE_TABLET | Freq: Two times a day (BID) | ORAL | 2 refills | Status: DC

## 2016-09-24 MED ORDER — CYCLOBENZAPRINE HCL 5 MG PO TABS: 5.0000 mg | ORAL_TABLET | Freq: Three times a day (TID) | ORAL | 3 refills | Status: DC | PRN

## 2016-09-24 MED ORDER — DIAZEPAM 5 MG PO TABS: 5.0000 mg | ORAL_TABLET | Freq: Four times a day (QID) | ORAL | 0 refills | Status: DC | PRN

## 2016-09-24 NOTE — Progress Notes (Signed)
Office Visit Note   Patient: Connie Black           Date of Birth: 03/24/1966           MRN: [id number] Visit Date: 09/24/2016              Requested by: No referring provider defined for this encounter. PCP: No PCP Per Patient   Assessment & Plan: Visit Diagnoses:  1. Lumbar radiculopathy     Plan: MRI L spine to r/o HNP.  Nsaids, valium, flexeril prescribed.  Will call patient with results and likely [facility]  Follow-Up Instructions: Return if symptoms worsen or fail to improve.   Orders:  Orders Placed This Encounter  Procedures  . MR Lumbar Spine w/o contrast   Meds ordered this encounter  Medications  . diclofenac (VOLTAREN) 75 MG EC tablet    Sig: Take 1 tablet (75 mg total) by mouth 2 (two) times daily.    Dispense:  30 tablet    Refill:  2  . cyclobenzaprine (FLEXERIL) 5 MG tablet    Sig: Take 1-2 tablets (5-10 mg total) by mouth 3 (three) times daily as needed for muscle spasms.    Dispense:  30 tablet    Refill:  3  . diazepam (VALIUM) 5 MG tablet    Sig: Take 1-2 tablets (5-10 mg total) by mouth every 6 (six) hours as needed for muscle spasms.    Dispense:  60 tablet    Refill:  0      Procedures: No procedures performed   Clinical Data: No additional findings.   Subjective: Chief Complaint  Patient presents with  . Lower Back - Pain    51 yo female with LLE radiculopathy since November.  Denies injuries.  Has had previous episodes similar to this that has resolved with conservative treatment.  This time, she continues to be symptomatic with constant radiating pain and numbness in toes, worse with activity.  Denies constitutional sxs or bowel/bladder dysfunction.      Review of Systems  Constitutional: Negative.   HENT: Negative.   Eyes: Negative.   Respiratory: Negative.   Cardiovascular: Negative.   Endocrine: Negative.   Musculoskeletal: Negative.   Neurological: Negative.   Hematological: Negative.     Psychiatric/Behavioral: Negative.   All other systems reviewed and are negative.    Objective: Vital Signs: LMP 11/03/2011   Physical Exam  Constitutional: She is oriented to person, place, and time. She appears well-developed and well-nourished.  HENT:  Head: Normocephalic and atraumatic.  Eyes: EOM are normal.  Neck: Neck supple.  Pulmonary/Chest: Effort normal.  Abdominal: Soft.  Neurological: She is alert and oriented to person, place, and time.  Skin: Skin is warm. Capillary refill takes less than 2 seconds.  Psychiatric: She has a normal mood and affect. Her behavior is normal. Judgment and thought content normal.  Nursing note and vitals reviewed.   Back Exam   Comments:  Symmetric reflexes No focal deficits Subjective numbness of toes Foot wwp +SLR      Specialty Comments:  No specialty comments available.  Imaging: No results found.   PMFS History: Patient Active Problem List   Diagnosis Date Noted  . Lumbar radiculopathy 09/24/2016  . S/P hysterectomy 12/24/2013  . Cellulitis and abscess of buttock 12/24/2013  . Ovarian torsion 08/21/2013  . Back pain 06/14/2011  . Anorexia 06/14/2011  . ASTHMA, INTERMITTENT 04/21/2010  . MIGRAINE HEADACHE 09/06/2009  . HERNIATED LUMBOSACRAL DISC 09/06/2009  .  SUBSTANCE ABUSE 05/26/2008  . BIPOLAR DISORDER 10/16/2006  . TOBACCO DEPENDENCE 10/16/2006  . HYPERTENSION, BENIGN SYSTEMIC 10/16/2006   Past Medical History:  Diagnosis Date  . Anxiety   . Asthma   . Bipolar 1 disorder (HCC)   . Eating disorder   . Hypertension   . Lumbar radiculopathy, chronic   . Migraines     Family History  Problem Relation Age of Onset  . Cancer Other     Past Surgical History:  Procedure Laterality Date  . "Release fluid from Brain" for Migraines June 2013    . ABDOMINAL HYSTERECTOMY    . LAPAROSCOPY Right 08/20/2013   Procedure: LAPAROSCOPY OPERATIVE;  Surgeon: Catalina Antigua, MD;  Location: [facility];  Service:  Gynecology;  Laterality: Right;   Social History   Occupational History  . Not on file.   Social History Main Topics  . Smoking status: Current Every Day Smoker    Packs/day: 0.50    Last attempt to quit: 08/09/2013  . Smokeless tobacco: Never Used     Comment: is trying to quitt  . Alcohol use Yes     Comment: occ  . Drug use: No  . Sexual activity: Yes    Birth control/ protection: Surgical

## 2016-10-09 ENCOUNTER — Other Ambulatory Visit: Payer: Self-pay

## 2018-03-12 ENCOUNTER — Emergency Department (HOSPITAL_BASED_OUTPATIENT_CLINIC_OR_DEPARTMENT_OTHER): Payer: Self-pay

## 2018-03-12 ENCOUNTER — Encounter (HOSPITAL_BASED_OUTPATIENT_CLINIC_OR_DEPARTMENT_OTHER): Payer: Self-pay | Admitting: Emergency Medicine

## 2018-03-12 ENCOUNTER — Other Ambulatory Visit: Payer: Self-pay

## 2018-03-12 ENCOUNTER — Emergency Department (HOSPITAL_BASED_OUTPATIENT_CLINIC_OR_DEPARTMENT_OTHER)
Admission: EM | Admit: 2018-03-12 | Discharge: 2018-03-13 | Disposition: A | Discharge status: Home / Self Care | Payer: Self-pay | Attending: Emergency Medicine | Admitting: Emergency Medicine

## 2018-03-12 DIAGNOSIS — K5289 Other specified noninfective gastroenteritis and colitis: Secondary | ICD-10-CM | POA: Insufficient documentation

## 2018-03-12 DIAGNOSIS — I10 Essential (primary) hypertension: Secondary | ICD-10-CM | POA: Insufficient documentation

## 2018-03-12 DIAGNOSIS — K5900 Constipation, unspecified: Secondary | ICD-10-CM

## 2018-03-12 DIAGNOSIS — N760 Acute vaginitis: Secondary | ICD-10-CM

## 2018-03-12 DIAGNOSIS — F17200 Nicotine dependence, unspecified, uncomplicated: Secondary | ICD-10-CM | POA: Insufficient documentation

## 2018-03-12 DIAGNOSIS — B9689 Other specified bacterial agents as the cause of diseases classified elsewhere: Secondary | ICD-10-CM

## 2018-03-12 DIAGNOSIS — N3000 Acute cystitis without hematuria: Secondary | ICD-10-CM

## 2018-03-12 DIAGNOSIS — K529 Noninfective gastroenteritis and colitis, unspecified: Secondary | ICD-10-CM

## 2018-03-12 DIAGNOSIS — J45909 Unspecified asthma, uncomplicated: Secondary | ICD-10-CM | POA: Insufficient documentation

## 2018-03-12 DIAGNOSIS — Z79899 Other long term (current) drug therapy: Secondary | ICD-10-CM | POA: Insufficient documentation

## 2018-03-12 LAB — CBC
HCT: 34.1 % — ABNORMAL LOW (ref 36.0–46.0)
HEMOGLOBIN: 12.1 g/dL (ref 12.0–15.0)
MCH: 34.1 pg — ABNORMAL HIGH (ref 26.0–34.0)
MCHC: 35.5 g/dL (ref 30.0–36.0)
MCV: 96.1 fL (ref 78.0–100.0)
Platelets: 191 10*3/uL (ref 150–400)
RBC: 3.55 MIL/uL — AB (ref 3.87–5.11)
RDW: 12.6 % (ref 11.5–15.5)
WBC: 11.5 10*3/uL — AB (ref 4.0–10.5)

## 2018-03-12 LAB — URINALYSIS, ROUTINE W REFLEX MICROSCOPIC
Glucose, UA: NEGATIVE mg/dL
Hgb urine dipstick: NEGATIVE
Ketones, ur: 15 mg/dL — AB
Leukocytes, UA: NEGATIVE
Nitrite: POSITIVE — AB
Protein, ur: 30 mg/dL — AB
Specific Gravity, Urine: >1.03 — ABNORMAL HIGH (ref 1.005–1.030)
pH: 5 (ref 5.0–8.0)

## 2018-03-12 LAB — COMPREHENSIVE METABOLIC PANEL
ALT: 16 U/L (ref 0–44)
AST: 29 U/L (ref 15–41)
Albumin: 3.9 g/dL (ref 3.5–5.0)
Alkaline Phosphatase: 67 U/L (ref 38–126)
Anion gap: 9 (ref 5–15)
BUN: 21 mg/dL — ABNORMAL HIGH (ref 6–20)
CHLORIDE: 105 mmol/L (ref 98–111)
CO2: 25 mmol/L (ref 22–32)
Calcium: 8.9 mg/dL (ref 8.9–10.3)
Creatinine, Ser: 0.94 mg/dL (ref 0.44–1.00)
GFR calc non Af Amer: >60 mL/min (ref >60)
Glucose, Bld: 123 mg/dL — ABNORMAL HIGH (ref 70–99)
Potassium: 3.5 mmol/L (ref 3.5–5.1)
Sodium: 139 mmol/L (ref 135–145)
Total Bilirubin: 0.5 mg/dL (ref 0.3–1.2)
Total Protein: 6.4 g/dL — ABNORMAL LOW (ref 6.5–8.1)

## 2018-03-12 LAB — URINALYSIS, MICROSCOPIC (REFLEX): WBC, UA: NONE SEEN WBC/hpf (ref 0–5)

## 2018-03-12 LAB — WET PREP, GENITAL
SPERM: NONE SEEN
Trich, Wet Prep: NONE SEEN
Yeast Wet Prep HPF POC: NONE SEEN

## 2018-03-12 LAB — CK: Total CK: 77 U/L (ref 38–234)

## 2018-03-12 LAB — LIPASE, BLOOD: Lipase: 26 U/L (ref 11–51)

## 2018-03-12 MED ORDER — SODIUM CHLORIDE 0.9 % IV BOLUS
1000.0000 mL | Freq: Once | INTRAVENOUS | Status: AC
  Administered 2018-03-12: 1000 mL via INTRAVENOUS

## 2018-03-12 MED ORDER — IOPAMIDOL (ISOVUE-300) INJECTION 61%
100.0000 mL | Freq: Once | INTRAVENOUS | Status: AC | PRN
  Administered 2018-03-12: 100 mL via INTRAVENOUS

## 2018-03-12 MED ORDER — HYDROCODONE-ACETAMINOPHEN 5-325 MG PO TABS: 1.0000 | ORAL_TABLET | Freq: Four times a day (QID) | ORAL | 0 refills | Status: DC | PRN

## 2018-03-12 MED ORDER — FENTANYL CITRATE (PF) 100 MCG/2ML IJ SOLN
50.0000 ug | Freq: Once | INTRAMUSCULAR | Status: AC
  Administered 2018-03-12: 50 ug via INTRAVENOUS
  Filled 2018-03-12: qty 2

## 2018-03-12 MED ORDER — ONDANSETRON HCL 4 MG PO TABS: 4.0000 mg | ORAL_TABLET | Freq: Three times a day (TID) | ORAL | 0 refills | Status: DC | PRN

## 2018-03-12 MED ORDER — CIPROFLOXACIN HCL 500 MG PO TABS: 500.0000 mg | ORAL_TABLET | Freq: Two times a day (BID) | ORAL | 0 refills | Status: AC

## 2018-03-12 MED ORDER — METRONIDAZOLE 500 MG PO TABS: 500.0000 mg | ORAL_TABLET | Freq: Two times a day (BID) | ORAL | 0 refills | Status: AC

## 2018-03-12 MED ORDER — ONDANSETRON HCL 4 MG/2ML IJ SOLN
4.0000 mg | Freq: Once | INTRAMUSCULAR | Status: AC
  Administered 2018-03-12: 4 mg via INTRAVENOUS
  Filled 2018-03-12: qty 2

## 2018-03-12 NOTE — Discharge Instructions (Addendum)
Take antibiotics as prescribed. Take the entire course, even if your symptoms improve.  Do not drink alcohol while taking this medication. Use Tylenol as needed for mild to moderate pain.  Use Norco as needed for severe breakthrough pain.  Have caution while taking this medication, may make you tired or groggy. Use Zofran as needed for nausea or vomiting. Call the 1-866 number and the back of the paperwork to help establish primary care. Return to the emergency room if you develop any new, worsening, or concerning symptoms.

## 2018-03-12 NOTE — ED Notes (Signed)
Pt voided in bedpan 

## 2018-03-12 NOTE — ED Triage Notes (Signed)
Lower abd pain today, dysuria, and nausea.

## 2018-03-13 LAB — GC/CHLAMYDIA PROBE AMP (~~LOC~~) NOT AT ARMC
CHLAMYDIA, DNA PROBE: NEGATIVE
NEISSERIA GONORRHEA: NEGATIVE

## 2018-03-13 NOTE — ED Provider Notes (Signed)
[facility] EMERGENCY DEPARTMENT Provider Note   CSN: [id number] Arrival date & time: 03/12/18  1813     History   Chief Complaint Chief Complaint  Patient presents with  . Abdominal Pain    HPI Connie Black is a 52 y.o. female presenting for evaluation of lower abdominal pain.  Patient states that she had acute onset suprapubic abdominal pain today.  She has associated nausea without vomiting.  She denies fevers, chills, chest pain, shortness of breath, abnormal bowel movements.  Last bowel movement was yesterday, it was not hard to runny.  Patient states she often goes several days in between having a bowel movement.  Prior to arrival, patient reports normal urination, no dysuria, hematuria, urinary frequency.  Sample given today in the ER was dark/brown.  Patient denies vaginal discharge.  She is sexually active with one female partner.  She has had a hysterectomy and bilateral nephrectomy, still has her cervix.  HPI  Past Medical History:  Diagnosis Date  . Anxiety   . Asthma   . Bipolar 1 disorder (HCC)   . Eating disorder   . Hypertension   . Lumbar radiculopathy, chronic   . Migraines     Patient Active Problem List   Diagnosis Date Noted  . Lumbar radiculopathy 09/24/2016  . S/P hysterectomy 12/24/2013  . Cellulitis and abscess of buttock 12/24/2013  . Ovarian torsion 08/21/2013  . Back pain 06/14/2011  . Anorexia 06/14/2011  . ASTHMA, INTERMITTENT 04/21/2010  . MIGRAINE HEADACHE 09/06/2009  . HERNIATED LUMBOSACRAL DISC 09/06/2009  . SUBSTANCE ABUSE 05/26/2008  . BIPOLAR DISORDER 10/16/2006  . TOBACCO DEPENDENCE 10/16/2006  . HYPERTENSION, BENIGN SYSTEMIC 10/16/2006    Past Surgical History:  Procedure Laterality Date  . "Release fluid from Brain" for Migraines June 2013    . ABDOMINAL HYSTERECTOMY    . LAPAROSCOPY Right 08/20/2013   Procedure: LAPAROSCOPY OPERATIVE;  Surgeon: Catalina Antigua, MD;  Location: [facility];  Service: Gynecology;   Laterality: Right;     OB History    Gravida  3   Para  3   Term  1   Preterm  2   AB      Living        SAB      TAB      Ectopic      Multiple      Live Births               Home Medications    Prior to Admission medications   Medication Sig Start Date End Date Taking? Authorizing Provider  cephALEXin (KEFLEX) 500 MG capsule Take 1 capsule (500 mg total) by mouth 4 (four) times daily. Patient not taking: Reported on 09/24/2016 08/12/16   Arthor Captain, PA-C  ciprofloxacin (CIPRO) 500 MG tablet Take 1 tablet (500 mg total) by mouth every 12 (twelve) hours for 10 days. 03/12/18 03/22/18  Doria Fern, PA-C  cyclobenzaprine (FLEXERIL) 5 MG tablet Take 1-2 tablets (5-10 mg total) by mouth 3 (three) times daily as needed for muscle spasms. 09/24/16   Tarry Kos, MD  diazepam (VALIUM) 5 MG tablet Take 1-2 tablets (5-10 mg total) by mouth every 6 (six) hours as needed for muscle spasms. 09/24/16   Tarry Kos, MD  diclofenac (VOLTAREN) 75 MG EC tablet Take 1 tablet (75 mg total) by mouth 2 (two) times daily. 09/24/16   Tarry Kos, MD  HYDROcodone-acetaminophen (NORCO/VICODIN) 5-325 MG tablet Take 1 tablet by mouth every 6 (six)  hours as needed for severe pain. 03/12/18   Vanessa Kampf, PA-C  methocarbamol (ROBAXIN) 500 MG tablet Take 1 tablet (500 mg total) by mouth 2 (two) times daily. Patient not taking: Reported on 09/24/2016 09/06/16   [name], PA-C  metoCLOPramide (REGLAN) 10 MG tablet Take 10 mg by mouth as needed for nausea (for Headache).    [provider]  metroNIDAZOLE (FLAGYL) 500 MG tablet Take 1 tablet (500 mg total) by mouth 2 (two) times daily for 10 days. 03/12/18 03/22/18  Sheylin Scharnhorst, PA-C  naproxen (NAPROSYN) 375 MG tablet Take 1 tablet (375 mg total) by mouth 2 (two) times daily. Patient not taking: Reported on 09/24/2016 08/12/16   Arthor Captain, PA-C  NORTRIPTYLINE HCL PO Take by mouth.    [provider]  ondansetron  (ZOFRAN) 4 MG tablet Take 1 tablet (4 mg total) by mouth every 8 (eight) hours as needed for nausea or vomiting. 03/12/18   Kendal Raffo, PA-C  predniSONE (DELTASONE) 20 MG tablet 2 tabs po daily x 4 days Patient not taking: Reported on 09/24/2016 09/06/16   [name], PA-C  QUEtiapine (SEROQUEL) 300 MG tablet Take 600 mg by mouth at bedtime.    [provider]  traMADol (ULTRAM) 50 MG tablet Take 1 tablet (50 mg total) by mouth every 6 (six) hours as needed for moderate pain or severe pain. Patient not taking: Reported on 09/24/2016 09/06/16   [name], PA-C    Family History Family History  Problem Relation Age of Onset  . Cancer Other     Social History Social History   Tobacco Use  . Smoking status: Current Every Day Smoker    Packs/day: 0.50    Last attempt to quit: 08/09/2013    Years since quitting: 4.5  . Smokeless tobacco: Never Used  . Tobacco comment: is trying to quitt  Substance Use Topics  . Alcohol use: Yes    Comment: occ  . Drug use: No     Allergies   Morphine   Review of Systems Review of Systems  Gastrointestinal: Positive for abdominal pain and nausea.  Genitourinary:       Dark urine  All other systems reviewed and are negative.    Physical Exam Updated Vital Signs BP (!) 186/97 (BP Location: Right Arm)   Pulse 82   Temp 98.4 F (36.9 C) (Oral)   Resp 16   Ht 5\' 7"  (1.702 m)   Wt 41.7 kg (92 lb)   LMP 11/03/2011   SpO2 100%   BMI 14.41 kg/m   Physical Exam  Constitutional: She is oriented to person, place, and time. She appears well-developed and well-nourished. No distress.  Patient appears uncomfortable due to pain.  In no acute distress  HENT:  Head: Normocephalic and atraumatic.  Eyes: Pupils are equal, round, and reactive to light. Conjunctivae and EOM are normal.  Neck: Normal range of motion. Neck supple.  Cardiovascular: Normal rate, regular rhythm and intact distal pulses.  Pulmonary/Chest: Effort normal and  breath sounds normal. No respiratory distress. She has no wheezes.  Abdominal: Soft. Bowel sounds are normal. She exhibits no distension and no mass. There is tenderness. There is no rebound and no guarding.  Tenderness palpation of suprapubic bilateral lower abdomen.  No rigidity, guarding, distention.  Pain is worse on the left side.  Genitourinary: Rectum normal and vagina normal. Pelvic exam was performed with patient supine. There is no rash, tenderness or lesion on the right labia. There is no  rash, tenderness or lesion on the left labia. Cervix exhibits no motion tenderness and no discharge.  Genitourinary Comments: Chaperone present.  No discharge noted on exam.  Tenderness to palpation of the suprapubic abd during exam  Musculoskeletal: Normal range of motion.  Neurological: She is alert and oriented to person, place, and time.  Skin: Skin is warm and dry. Capillary refill takes less than 2 seconds.  Psychiatric: She has a normal mood and affect.  Nursing note and vitals reviewed.    ED Treatments / Results  Labs (all labs ordered are listed, but only abnormal results are displayed) Labs Reviewed  WET PREP, GENITAL - Abnormal; Notable for the following components:      Result Value   Clue Cells Wet Prep HPF POC PRESENT (*)    WBC, Wet Prep HPF POC MODERATE (*)    All other components within normal limits  COMPREHENSIVE METABOLIC PANEL - Abnormal; Notable for the following components:   Glucose, Bld 123 (*)    BUN 21 (*)    Total Protein 6.4 (*)    All other components within normal limits  CBC - Abnormal; Notable for the following components:   WBC 11.5 (*)    RBC 3.55 (*)    HCT 34.1 (*)    MCH 34.1 (*)    All other components within normal limits  URINALYSIS, ROUTINE W REFLEX MICROSCOPIC - Abnormal; Notable for the following components:   Color, Urine BROWN (*)    Specific Gravity, Urine >1.030 (*)    Bilirubin Urine MODERATE (*)    Ketones, ur 15 (*)    Protein, ur  30 (*)    Nitrite POSITIVE (*)    All other components within normal limits  URINALYSIS, MICROSCOPIC (REFLEX) - Abnormal; Notable for the following components:   Bacteria, UA RARE (*)    All other components within normal limits  URINE CULTURE  LIPASE, BLOOD  CK  GC/CHLAMYDIA PROBE AMP ([place]) NOT [facility]    EKG None  Radiology Ct Abdomen Pelvis W Contrast  Result Date: 03/12/2018 CLINICAL DATA:  Lower abdominal pain today. Nausea and vomiting. Dysuria. EXAM: CT ABDOMEN AND PELVIS WITH CONTRAST TECHNIQUE: Multidetector CT imaging of the abdomen and pelvis was performed using the standard protocol following bolus administration of intravenous contrast. CONTRAST:  100mL ISOVUE-300 IOPAMIDOL (ISOVUE-300) INJECTION 61% COMPARISON:  08/20/2013 FINDINGS: Lower chest: Mild dependent atelectasis in the lung bases. Hepatobiliary: No focal liver abnormality is seen. No gallstones, gallbladder wall thickening, or biliary dilatation. Pancreas: Unremarkable. No pancreatic ductal dilatation or surrounding inflammatory changes. Spleen: Normal in size without focal abnormality. Adrenals/Urinary Tract: No adrenal gland nodules. Subcentimeter cyst in the right kidney. Nephrograms are symmetrical. No hydronephrosis or hydroureter. Bladder is moderately distended but no wall thickening or filling defects are demonstrated. Stomach/Bowel: Stomach, small bowel, and colon are not abnormally distended. Stool fills the colon. There is evidence of wall thickening in the colon most prominent at the splenic flexure and transverse regions. This is nonspecific but likely indicate colitis. Appendix is normal. Vascular/Lymphatic: A few scattered calcifications are demonstrated in the abdominal aorta. No aneurysm. No significant lymphadenopathy. Reproductive: Status post hysterectomy. No adnexal masses. Other: No free air or free fluid in the abdomen. Abdominal wall musculature appears intact. Musculoskeletal: No destructive  bone lesions. Tarlov cysts in the sacrum. IMPRESSION: 1. Wall thickening in the colon most prominent at the splenic flexure and transverse regions. This is nonspecific but likely to indicate colitis. No evidence of obstruction. 2.  Diffusely stool-filled colon may indicate constipation in the appropriate clinical setting. Electronically Signed   By: Burman Nieves M.D.   On: 03/12/2018 23:00    Procedures Procedures (including critical care time)  Medications Ordered in ED Medications  fentaNYL (SUBLIMAZE) injection 50 mcg (50 mcg Intravenous Given 03/12/18 1945)  ondansetron (ZOFRAN) injection 4 mg (4 mg Intravenous Given 03/12/18 1941)  sodium chloride 0.9 % bolus 1,000 mL (0 mLs Intravenous Stopped 03/12/18 2114)  fentaNYL (SUBLIMAZE) injection 50 mcg (50 mcg Intravenous Given 03/12/18 2031)  fentaNYL (SUBLIMAZE) injection 50 mcg (50 mcg Intravenous Given 03/12/18 2212)  iopamidol (ISOVUE-300) 61 % injection 100 mL (100 mLs Intravenous Contrast Given 03/12/18 2230)  fentaNYL (SUBLIMAZE) injection 50 mcg (50 mcg Intravenous Given 03/12/18 2347)     Initial Impression / Assessment and Plan / ED Course  I have reviewed the triage vital signs and the nursing notes.  Pertinent labs & imaging results that were available during my care of the patient were reviewed by me and considered in my medical decision making (see chart for details).     Patient presenting for evaluation of acute onset abdominal pain.  Physical exam shows patient appears uncomfortable, but in no acute distress.  She is afebrile not tachycardic.  Will obtain labs, urine, and perform pelvic exam and reassess.  Labs show mild leukocytosis of 11.5.  Hemoglobin stable.  Creatinine stable.  UA shows signs for possible infection including positive nitrites and bacteria.  CK negative.  Wet prep positive for clue cells.  Gonorrhea/chlamydia sent.  Urine culture sent.  As patient had continued tenderness palpation of her abdomen, will  obtain CT abdomen.  Pain improved with fentanyl and nausea resolved with Zofran.  CT abdomen shows colitis and constipation.  Discussed with attending, Dr. Rush Landmark agrees to plan.  Will treat with Cipro and Flagyl to cover for colitis, UTI, and BV.  Discussed importance of hydration and use of MiraLAX as needed for constipation.  Patient encouraged to follow-up with a primary care doctor.  At this time, patient appears safe for discharge.  Return precautions given.  Patient states she understands and agrees plan.   Final Clinical Impressions(s) / ED Diagnoses   Final diagnoses:  Colitis  BV (bacterial vaginosis)  Acute cystitis without hematuria  Constipation, unspecified constipation type    ED Discharge Orders        Ordered    HYDROcodone-acetaminophen (NORCO/VICODIN) 5-325 MG tablet  Every 6 hours PRN     03/12/18 2323    ondansetron (ZOFRAN) 4 MG tablet  Every 8 hours PRN     03/12/18 2323    ciprofloxacin (CIPRO) 500 MG tablet  Every 12 hours     03/12/18 2323    metroNIDAZOLE (FLAGYL) 500 MG tablet  2 times daily     03/12/18 2323       Alveria Apley, PA-C 03/13/18 0009    Tegeler, Canary Brim, MD 03/13/18 [phone]

## 2018-03-14 LAB — URINE CULTURE: Culture: NO GROWTH

## 2018-07-09 ENCOUNTER — Emergency Department (HOSPITAL_COMMUNITY)
Admission: EM | Admit: 2018-07-09 | Discharge: 2018-07-09 | Disposition: A | Discharge status: Home / Self Care | Payer: Self-pay | Attending: Emergency Medicine | Admitting: Emergency Medicine

## 2018-07-09 ENCOUNTER — Encounter (HOSPITAL_COMMUNITY): Payer: Self-pay | Admitting: Emergency Medicine

## 2018-07-09 ENCOUNTER — Emergency Department (HOSPITAL_COMMUNITY): Payer: Self-pay

## 2018-07-09 DIAGNOSIS — F17210 Nicotine dependence, cigarettes, uncomplicated: Secondary | ICD-10-CM | POA: Insufficient documentation

## 2018-07-09 DIAGNOSIS — R42 Dizziness and giddiness: Secondary | ICD-10-CM | POA: Insufficient documentation

## 2018-07-09 DIAGNOSIS — I10 Essential (primary) hypertension: Secondary | ICD-10-CM | POA: Insufficient documentation

## 2018-07-09 DIAGNOSIS — R55 Syncope and collapse: Secondary | ICD-10-CM | POA: Insufficient documentation

## 2018-07-09 DIAGNOSIS — J45909 Unspecified asthma, uncomplicated: Secondary | ICD-10-CM | POA: Insufficient documentation

## 2018-07-09 DIAGNOSIS — Z79899 Other long term (current) drug therapy: Secondary | ICD-10-CM | POA: Insufficient documentation

## 2018-07-09 LAB — BASIC METABOLIC PANEL
Anion gap: 10 (ref 5–15)
BUN: 18 mg/dL (ref 6–20)
CALCIUM: 9.8 mg/dL (ref 8.9–10.3)
CHLORIDE: 96 mmol/L — AB (ref 98–111)
CO2: 28 mmol/L (ref 22–32)
CREATININE: 1.16 mg/dL — AB (ref 0.44–1.00)
GFR calc non Af Amer: 53 mL/min — ABNORMAL LOW (ref >60)
Glucose, Bld: 135 mg/dL — ABNORMAL HIGH (ref 70–99)
Potassium: 3.2 mmol/L — ABNORMAL LOW (ref 3.5–5.1)
Sodium: 134 mmol/L — ABNORMAL LOW (ref 135–145)

## 2018-07-09 LAB — CBC
HEMATOCRIT: 34.7 % — AB (ref 36.0–46.0)
Hemoglobin: 12 g/dL (ref 12.0–15.0)
MCH: 33.6 pg (ref 26.0–34.0)
MCHC: 34.6 g/dL (ref 30.0–36.0)
MCV: 97.2 fL (ref 80.0–100.0)
Platelets: 226 10*3/uL (ref 150–400)
RBC: 3.57 MIL/uL — ABNORMAL LOW (ref 3.87–5.11)
RDW: 11.8 % (ref 11.5–15.5)
WBC: 11 10*3/uL — ABNORMAL HIGH (ref 4.0–10.5)
nRBC: 0 % (ref 0.0–0.2)

## 2018-07-09 LAB — URINALYSIS, ROUTINE W REFLEX MICROSCOPIC
Bilirubin Urine: NEGATIVE
Glucose, UA: NEGATIVE mg/dL
Hgb urine dipstick: NEGATIVE
Ketones, ur: NEGATIVE mg/dL
LEUKOCYTES UA: NEGATIVE
NITRITE: NEGATIVE
Protein, ur: NEGATIVE mg/dL
SPECIFIC GRAVITY, URINE: 1.009 (ref 1.005–1.030)
pH: 6 (ref 5.0–8.0)

## 2018-07-09 LAB — CBG MONITORING, ED: GLUCOSE-CAPILLARY: 145 mg/dL — AB (ref 70–99)

## 2018-07-09 MED ORDER — OXYCODONE-ACETAMINOPHEN 5-325 MG PO TABS
1.0000 | ORAL_TABLET | Freq: Once | ORAL | Status: AC
  Administered 2018-07-09: 1 via ORAL
  Filled 2018-07-09: qty 1

## 2018-07-09 NOTE — ED Triage Notes (Signed)
  Patient BIB EMS after syncopal episode that resulted in a fall.  Patient states she took her nightly seroquel and went to use the bathroom. Patient started feeling dizzy and passed out, hitting the nightstand.  Patient hit behind R ear, and back of head.  Patient had LOC and states she doesn't remember what time it happened.  Patient states pain 10/10.  A&O x 4.

## 2018-07-09 NOTE — ED Notes (Signed)
Pt. Ambulated to and from bathroom. No SOB/ dizziness stated. Gait was good.

## 2018-07-09 NOTE — Discharge Instructions (Addendum)
Follow up with your doctor for recheck in 2-3 days. Return here with any worsening symptoms or new concerns.

## 2018-07-09 NOTE — ED Provider Notes (Signed)
[facility] EMERGENCY DEPARTMENT Provider Note   CSN: [id number] Arrival date & time: 07/09/18  0126     History   Chief Complaint Chief Complaint  Patient presents with  . Fall  . Loss of Consciousness    HPI Connie Black is a 52 y.o. female.  Patient here for evaluation of syncopal episode that occurred tonight while at home. She states she got up from sleep to go to the bathroom, got dizzy and passed out. She does not know how long she was unconscious. No pre-event chest pain, SOB. Since the event, there has been no chest pain, nausea, vomiting. She took a Seroquel before going to bed as usual and states that, when getting up after taking this medication, she usually sits for a time and then stands to walk without difficulty or symptoms. She does not feel she did that tonight.   The history is provided by the patient. No language interpreter was used.    Past Medical History:  Diagnosis Date  . Anxiety   . Asthma   . Bipolar 1 disorder (HCC)   . Eating disorder   . Hypertension   . Lumbar radiculopathy, chronic   . Migraines     Patient Active Problem List   Diagnosis Date Noted  . Lumbar radiculopathy 09/24/2016  . S/P hysterectomy 12/24/2013  . Cellulitis and abscess of buttock 12/24/2013  . Ovarian torsion 08/21/2013  . Back pain 06/14/2011  . Anorexia 06/14/2011  . ASTHMA, INTERMITTENT 04/21/2010  . MIGRAINE HEADACHE 09/06/2009  . HERNIATED LUMBOSACRAL DISC 09/06/2009  . SUBSTANCE ABUSE 05/26/2008  . BIPOLAR DISORDER 10/16/2006  . TOBACCO DEPENDENCE 10/16/2006  . HYPERTENSION, BENIGN SYSTEMIC 10/16/2006    Past Surgical History:  Procedure Laterality Date  . "Release fluid from Brain" for Migraines June 2013    . ABDOMINAL HYSTERECTOMY    . LAPAROSCOPY Right 08/20/2013   Procedure: LAPAROSCOPY OPERATIVE;  Surgeon: Catalina Antigua, MD;  Location: [facility];  Service: Gynecology;  Laterality: Right;     OB History    Gravida  3   Para  3   Term  1   Preterm  2   AB      Living        SAB      TAB      Ectopic      Multiple      Live Births               Home Medications    Prior to Admission medications   Medication Sig Start Date End Date Taking? Authorizing Provider  cephALEXin (KEFLEX) 500 MG capsule Take 1 capsule (500 mg total) by mouth 4 (four) times daily. Patient not taking: Reported on 09/24/2016 08/12/16   Arthor Captain, PA-C  cyclobenzaprine (FLEXERIL) 5 MG tablet Take 1-2 tablets (5-10 mg total) by mouth 3 (three) times daily as needed for muscle spasms. 09/24/16   Tarry Kos, MD  diazepam (VALIUM) 5 MG tablet Take 1-2 tablets (5-10 mg total) by mouth every 6 (six) hours as needed for muscle spasms. 09/24/16   Tarry Kos, MD  diclofenac (VOLTAREN) 75 MG EC tablet Take 1 tablet (75 mg total) by mouth 2 (two) times daily. 09/24/16   Tarry Kos, MD  HYDROcodone-acetaminophen (NORCO/VICODIN) 5-325 MG tablet Take 1 tablet by mouth every 6 (six) hours as needed for severe pain. 03/12/18   Caccavale, Sophia, PA-C  methocarbamol (ROBAXIN) 500 MG tablet Take 1 tablet (500 mg total)  by mouth 2 (two) times daily. Patient not taking: Reported on 09/24/2016 09/06/16   [name], PA-C  metoCLOPramide (REGLAN) 10 MG tablet Take 10 mg by mouth as needed for nausea (for Headache).    [provider]  naproxen (NAPROSYN) 375 MG tablet Take 1 tablet (375 mg total) by mouth 2 (two) times daily. Patient not taking: Reported on 09/24/2016 08/12/16   Arthor Captain, PA-C  NORTRIPTYLINE HCL PO Take by mouth.    [provider]  ondansetron (ZOFRAN) 4 MG tablet Take 1 tablet (4 mg total) by mouth every 8 (eight) hours as needed for nausea or vomiting. 03/12/18   Caccavale, Sophia, PA-C  predniSONE (DELTASONE) 20 MG tablet 2 tabs po daily x 4 days Patient not taking: Reported on 09/24/2016 09/06/16   [name], PA-C  QUEtiapine (SEROQUEL) 300 MG tablet Take 600 mg by mouth at bedtime.     [provider]  traMADol (ULTRAM) 50 MG tablet Take 1 tablet (50 mg total) by mouth every 6 (six) hours as needed for moderate pain or severe pain. Patient not taking: Reported on 09/24/2016 09/06/16   [name], PA-C    Family History Family History  Problem Relation Age of Onset  . Cancer Other     Social History Social History   Tobacco Use  . Smoking status: Current Every Day Smoker    Packs/day: 0.50    Last attempt to quit: 08/09/2013    Years since quitting: 4.9  . Smokeless tobacco: Never Used  . Tobacco comment: is trying to quitt  Substance Use Topics  . Alcohol use: Yes    Comment: occ  . Drug use: No     Allergies   Morphine   Review of Systems Review of Systems  Constitutional: Negative for chills and fever.  HENT: Negative.   Eyes: Negative for visual disturbance.  Respiratory: Negative.  Negative for shortness of breath.   Cardiovascular: Negative.  Negative for chest pain.  Gastrointestinal: Negative.  Negative for nausea and vomiting.  Musculoskeletal: Positive for neck pain.  Skin: Negative.   Neurological: Positive for dizziness and syncope.     Physical Exam Updated Vital Signs BP (!) 141/76   Pulse (!) 50   Temp (!) 97.5 F (36.4 C) (Oral)   Resp 12   Ht 5\' 7"  (1.702 m)   Wt 41.7 kg   LMP 11/03/2011   SpO2 100%   BMI 14.40 kg/m   Physical Exam  Constitutional: She is oriented to person, place, and time. She appears well-developed and well-nourished.  HENT:  Head: Normocephalic and atraumatic.  Neck: Normal range of motion. Neck supple.  There is midline and right paracervical tenderness.   Cardiovascular: Normal rate and regular rhythm.  Pulmonary/Chest: Effort normal and breath sounds normal. She has no wheezes. She has no rales. She exhibits no tenderness.  Abdominal: Soft. Bowel sounds are normal. There is no tenderness. There is no rebound and no guarding.  Musculoskeletal: Normal range of motion.  No other spinal  tenderness.   Neurological: She is alert and oriented to person, place, and time. She has normal strength. She exhibits normal muscle tone. She displays a negative Romberg sign. Coordination and gait normal. GCS eye subscore is 4. GCS verbal subscore is 5. GCS motor subscore is 6.  CN's 3-12 intact.  Skin: Skin is warm and dry. No rash noted.  Psychiatric: She has a normal mood and affect.  Nursing note and vitals reviewed.    ED Treatments /  Results  Labs (all labs ordered are listed, but only abnormal results are displayed) Labs Reviewed  BASIC METABOLIC PANEL - Abnormal; Notable for the following components:      Result Value   Sodium 134 (*)    Potassium 3.2 (*)    Chloride 96 (*)    Glucose, Bld 135 (*)    Creatinine, Ser 1.16 (*)    GFR calc non Af Amer 53 (*)    All other components within normal limits  CBC - Abnormal; Notable for the following components:   WBC 11.0 (*)    RBC 3.57 (*)    HCT 34.7 (*)    All other components within normal limits  URINALYSIS, ROUTINE W REFLEX MICROSCOPIC - Abnormal; Notable for the following components:   Color, Urine STRAW (*)    All other components within normal limits  CBG MONITORING, ED - Abnormal; Notable for the following components:   Glucose-Capillary 145 (*)    All other components within normal limits    EKG EKG Interpretation  Date/Time:  Thursday July 09 2018 01:28:19 EST Ventricular Rate:  60 PR Interval:    QRS Duration: 83 QT Interval:  456 QTC Calculation: 456 R Axis:   82 Text Interpretation:  Sinus rhythm LAE, consider biatrial enlargement Anterior infarct, old Nonspecific T abnormalities, lateral leads Minimal ST elevation, inferior leads When compared with ECG of 12/30/2011, HEART RATE has decreased Nonspecific T wave abnormality is no longer present Confirmed by Dione Booze ([id number]) on 07/09/2018 1:35:52 AM   Radiology Dg Cervical Spine Complete  Result Date: 07/09/2018 CLINICAL DATA:  52 y/o F;  syncope and neck pain after head injury on a dresser this morning. EXAM: CERVICAL SPINE - COMPLETE 4+ VIEW COMPARISON:  None. FINDINGS: C1-T1 visible on the lateral view. Straightening of cervical lordosis without listhesis. Mild-to-moderate multilevel discogenic degenerative changes with endplate marginal osteophytes and loss of intervertebral disc space height greatest at the C6-7 level. Prominent right-sided mid cervical facet arthrosis. No loss of vertebral body height. The odontoid process is intact. Normal anterior C1-2 articulation. No prevertebral soft tissue thickening. IMPRESSION: 1. No acute fracture or dislocation identified. 2. Mild-to-moderate cervical spondylosis greatest at the C6-7 level. Electronically Signed   By: Mitzi Hansen M.D.   On: 07/09/2018 03:42    Procedures Procedures (including critical care time)  Medications Ordered in ED Medications  oxyCODONE-acetaminophen (PERCOCET/ROXICET) 5-325 MG per tablet 1 tablet (1 tablet Oral Given 07/09/18 0336)     Initial Impression / Assessment and Plan / ED Course  I have reviewed the triage vital signs and the nursing notes.  Pertinent labs & imaging results that were available during my care of the patient were reviewed by me and considered in my medical decision making (see chart for details).     Patient presents after a syncopal episode during the night when she got up to go to the bathroom. She feels she may have gotten up too quickly. She takes Seroquel at night and there has been no change in dosing recently. No recent illness, fever, nausea or diarrhea.   Labs are reassuring. The patient has had a hysterectomy. No orthostasis. She is feeling better and ambulates without symptoms of dizziness or lightheadedness. She is felt appropriate for discharge home with likely vasovagal syncope.   Final Clinical Impressions(s) / ED Diagnoses   Final diagnoses:  None   1. vasovagal syncope  ED Discharge Orders     None       Elpidio Anis,  PA-C 07/09/18 0518    Dione BoozeGlick, David, MD 07/09/18 [phone]

## 2019-08-15 ENCOUNTER — Emergency Department (HOSPITAL_BASED_OUTPATIENT_CLINIC_OR_DEPARTMENT_OTHER)
Admission: EM | Admit: 2019-08-15 | Discharge: 2019-08-15 | Disposition: A | Discharge status: Home / Self Care | Payer: Medicaid - Out of State | Attending: Emergency Medicine | Admitting: Emergency Medicine

## 2019-08-15 ENCOUNTER — Encounter (HOSPITAL_BASED_OUTPATIENT_CLINIC_OR_DEPARTMENT_OTHER): Payer: Self-pay | Admitting: Emergency Medicine

## 2019-08-15 ENCOUNTER — Other Ambulatory Visit: Payer: Self-pay

## 2019-08-15 DIAGNOSIS — Z87891 Personal history of nicotine dependence: Secondary | ICD-10-CM | POA: Insufficient documentation

## 2019-08-15 DIAGNOSIS — E860 Dehydration: Secondary | ICD-10-CM

## 2019-08-15 DIAGNOSIS — R519 Headache, unspecified: Secondary | ICD-10-CM | POA: Insufficient documentation

## 2019-08-15 DIAGNOSIS — R1084 Generalized abdominal pain: Secondary | ICD-10-CM | POA: Diagnosis not present

## 2019-08-15 DIAGNOSIS — J45909 Unspecified asthma, uncomplicated: Secondary | ICD-10-CM | POA: Insufficient documentation

## 2019-08-15 DIAGNOSIS — R197 Diarrhea, unspecified: Secondary | ICD-10-CM

## 2019-08-15 DIAGNOSIS — I10 Essential (primary) hypertension: Secondary | ICD-10-CM | POA: Diagnosis not present

## 2019-08-15 DIAGNOSIS — Z79899 Other long term (current) drug therapy: Secondary | ICD-10-CM | POA: Insufficient documentation

## 2019-08-15 DIAGNOSIS — Z20828 Contact with and (suspected) exposure to other viral communicable diseases: Secondary | ICD-10-CM | POA: Insufficient documentation

## 2019-08-15 LAB — CBC WITH DIFFERENTIAL/PLATELET
Abs Immature Granulocytes: 0.02 10*3/uL (ref 0.00–0.07)
Basophils Absolute: 0.1 10*3/uL (ref 0.0–0.1)
Basophils Relative: 1 %
Eosinophils Absolute: 0.5 10*3/uL (ref 0.0–0.5)
Eosinophils Relative: 6 %
HCT: 39.7 % (ref 36.0–46.0)
Hemoglobin: 14 g/dL (ref 12.0–15.0)
Immature Granulocytes: 0 %
Lymphocytes Relative: 21 %
Lymphs Abs: 1.8 10*3/uL (ref 0.7–4.0)
MCH: 34.3 pg — ABNORMAL HIGH (ref 26.0–34.0)
MCHC: 35.3 g/dL (ref 30.0–36.0)
MCV: 97.3 fL (ref 80.0–100.0)
Monocytes Absolute: 0.4 10*3/uL (ref 0.1–1.0)
Monocytes Relative: 5 %
Neutro Abs: 5.9 10*3/uL (ref 1.7–7.7)
Neutrophils Relative %: 67 %
Platelets: 208 10*3/uL (ref 150–400)
RBC: 4.08 MIL/uL (ref 3.87–5.11)
RDW: 11.7 % (ref 11.5–15.5)
WBC: 8.7 10*3/uL (ref 4.0–10.5)
nRBC: 0 % (ref 0.0–0.2)

## 2019-08-15 LAB — URINALYSIS, ROUTINE W REFLEX MICROSCOPIC
Bilirubin Urine: NEGATIVE
Glucose, UA: NEGATIVE mg/dL
Hgb urine dipstick: NEGATIVE
Ketones, ur: 40 mg/dL — AB
Leukocytes,Ua: NEGATIVE
Nitrite: NEGATIVE
Protein, ur: NEGATIVE mg/dL
Specific Gravity, Urine: 1.025 (ref 1.005–1.030)
pH: 5 (ref 5.0–8.0)

## 2019-08-15 LAB — COMPREHENSIVE METABOLIC PANEL
ALT: 26 U/L (ref 0–44)
AST: 46 U/L — ABNORMAL HIGH (ref 15–41)
Albumin: 4.7 g/dL (ref 3.5–5.0)
Alkaline Phosphatase: 62 U/L (ref 38–126)
Anion gap: 15 (ref 5–15)
BUN: 29 mg/dL — ABNORMAL HIGH (ref 6–20)
CO2: 24 mmol/L (ref 22–32)
Calcium: 9.5 mg/dL (ref 8.9–10.3)
Chloride: 94 mmol/L — ABNORMAL LOW (ref 98–111)
Creatinine, Ser: 1.02 mg/dL — ABNORMAL HIGH (ref 0.44–1.00)
GFR calc Af Amer: >60 mL/min (ref >60)
GFR calc non Af Amer: >60 mL/min (ref >60)
Glucose, Bld: 89 mg/dL (ref 70–99)
Potassium: 3.3 mmol/L — ABNORMAL LOW (ref 3.5–5.1)
Sodium: 133 mmol/L — ABNORMAL LOW (ref 135–145)
Total Bilirubin: 1.1 mg/dL (ref 0.3–1.2)
Total Protein: 7.6 g/dL (ref 6.5–8.1)

## 2019-08-15 LAB — SARS CORONAVIRUS 2 AG (30 MIN TAT): SARS Coronavirus 2 Ag: NEGATIVE

## 2019-08-15 LAB — LIPASE, BLOOD: Lipase: 20 U/L (ref 11–51)

## 2019-08-15 MED ORDER — METOCLOPRAMIDE HCL 5 MG/ML IJ SOLN
10.0000 mg | Freq: Once | INTRAMUSCULAR | Status: AC
  Administered 2019-08-15: 10 mg via INTRAVENOUS
  Filled 2019-08-15: qty 2

## 2019-08-15 MED ORDER — KETOROLAC TROMETHAMINE 15 MG/ML IJ SOLN
15.0000 mg | Freq: Once | INTRAMUSCULAR | Status: AC
  Administered 2019-08-15: 10:00:00 15 mg via INTRAVENOUS
  Filled 2019-08-15: qty 1

## 2019-08-15 MED ORDER — DIPHENHYDRAMINE HCL 50 MG/ML IJ SOLN
25.0000 mg | Freq: Once | INTRAMUSCULAR | Status: AC
  Administered 2019-08-15: 25 mg via INTRAVENOUS
  Filled 2019-08-15: qty 1

## 2019-08-15 MED ORDER — SODIUM CHLORIDE 0.9 % IV SOLN: Freq: Once | INTRAVENOUS | Status: AC

## 2019-08-15 NOTE — ED Triage Notes (Signed)
Headache behind her eyes, feels like typical migraine. Denies fever, cough, congestion, N/V. Also c/o diarrhea since last night.

## 2019-08-15 NOTE — ED Provider Notes (Signed)
[facility] EMERGENCY DEPARTMENT Provider Note   CSN: [id number] Arrival date & time: 08/15/19  0848     History Chief Complaint  Patient presents with  . Headache  . Diarrhea    Connie Black is a 53 y.o. female.  HPI  Patient is 53 year old female with history of migraines since she was 53 years old who has tried numerous therapies including Botox, triptans, "prophylaxis"who continues to suffer from migraines.  States that she sees a migraine specialist in [place] where she moved recently however she was back visiting [place] and experienced a headache that began yesterday with gradual onset.  Patient denies any slurred speech, double vision, changes in vision, vomiting, neck stiffness, discoordination, confusion.  States that she has pressure behind her eyes and states the pain is 14/10.  Patient states this pain is similar to her classic migraine.  States that she has a triptan medication prescribed her but did not take it today.  Has not taken any Tylenol ibuprofen.  Patient states that migraine cocktails in the emergency department does work for her in the past.  Patient additionally denies any urinary symptoms, dysuria, frequency, urgency.  States no radiation of pain.  Endorses associated diarrhea and fatigue.  Patient has any cough, congestion, headache, dizziness.  Patient states she has some generalized abdominal pain that she describes as achy and uncomfortable.  Denies any radiation of pain.  States it is mild and started yesterday evening.  Patient denies any recent antibiotic use, recent out of country travel, unusual foods, denies any bloody diarrhea or fevers.  Patient denies any loss of taste or smell.  States she has no known Covid exposure.  Additionally, patient states that she takes 2 blood pressure medications but cannot remember the name of either.  States that she did not take her blood pressure medications this morning she only takes them at  night as they make her feel drowsy otherwise.    Past Medical History:  Diagnosis Date  . Anxiety   . Asthma   . Bipolar 1 disorder (HCC)   . Eating disorder   . Hypertension   . Lumbar radiculopathy, chronic   . Migraines     Patient Active Problem List   Diagnosis Date Noted  . Lumbar radiculopathy 09/24/2016  . S/P hysterectomy 12/24/2013  . Cellulitis and abscess of buttock 12/24/2013  . Ovarian torsion 08/21/2013  . Back pain 06/14/2011  . Anorexia 06/14/2011  . ASTHMA, INTERMITTENT 04/21/2010  . MIGRAINE HEADACHE 09/06/2009  . HERNIATED LUMBOSACRAL DISC 09/06/2009  . SUBSTANCE ABUSE 05/26/2008  . BIPOLAR DISORDER 10/16/2006  . TOBACCO DEPENDENCE 10/16/2006  . HYPERTENSION, BENIGN SYSTEMIC 10/16/2006    Past Surgical History:  Procedure Laterality Date  . "Release fluid from Brain" for Migraines June 2013    . ABDOMINAL HYSTERECTOMY    . LAPAROSCOPY Right 08/20/2013   Procedure: LAPAROSCOPY OPERATIVE;  Surgeon: Catalina Antigua, MD;  Location: [facility];  Service: Gynecology;  Laterality: Right;     OB History    Gravida  3   Para  3   Term  1   Preterm  2   AB      Living        SAB      TAB      Ectopic      Multiple      Live Births              Family History  Problem Relation Age of Onset  .  Cancer Other     Social History   Tobacco Use  . Smoking status: Former Smoker    Packs/day: 0.50    Quit date: 08/09/2013    Years since quitting: 6.0  . Smokeless tobacco: Never Used  . Tobacco comment: is trying to quitt  Substance Use Topics  . Alcohol use: Yes    Comment: occ  . Drug use: No    Home Medications Prior to Admission medications   Medication Sig Start Date End Date Taking? Authorizing Provider  cephALEXin (KEFLEX) 500 MG capsule Take 1 capsule (500 mg total) by mouth 4 (four) times daily. Patient not taking: Reported on 09/24/2016 08/12/16   Arthor Captain, PA-C  cyclobenzaprine (FLEXERIL) 5 MG tablet Take 1-2 tablets  (5-10 mg total) by mouth 3 (three) times daily as needed for muscle spasms. 09/24/16   Tarry Kos, MD  diazepam (VALIUM) 5 MG tablet Take 1-2 tablets (5-10 mg total) by mouth every 6 (six) hours as needed for muscle spasms. 09/24/16   Tarry Kos, MD  diclofenac (VOLTAREN) 75 MG EC tablet Take 1 tablet (75 mg total) by mouth 2 (two) times daily. 09/24/16   Tarry Kos, MD  HYDROcodone-acetaminophen (NORCO/VICODIN) 5-325 MG tablet Take 1 tablet by mouth every 6 (six) hours as needed for severe pain. 03/12/18   Caccavale, Sophia, PA-C  methocarbamol (ROBAXIN) 500 MG tablet Take 1 tablet (500 mg total) by mouth 2 (two) times daily. Patient not taking: Reported on 09/24/2016 09/06/16   [name], PA-C  metoCLOPramide (REGLAN) 10 MG tablet Take 10 mg by mouth as needed for nausea (for Headache).    [provider]  naproxen (NAPROSYN) 375 MG tablet Take 1 tablet (375 mg total) by mouth 2 (two) times daily. Patient not taking: Reported on 09/24/2016 08/12/16   Arthor Captain, PA-C  NORTRIPTYLINE HCL PO Take by mouth.    [provider]  ondansetron (ZOFRAN) 4 MG tablet Take 1 tablet (4 mg total) by mouth every 8 (eight) hours as needed for nausea or vomiting. 03/12/18   Caccavale, Sophia, PA-C  predniSONE (DELTASONE) 20 MG tablet 2 tabs po daily x 4 days Patient not taking: Reported on 09/24/2016 09/06/16   [name], PA-C  QUEtiapine (SEROQUEL) 300 MG tablet Take 600 mg by mouth at bedtime.    [provider]  traMADol (ULTRAM) 50 MG tablet Take 1 tablet (50 mg total) by mouth every 6 (six) hours as needed for moderate pain or severe pain. Patient not taking: Reported on 09/24/2016 09/06/16   [name], PA-C    Allergies    Morphine  Review of Systems   Review of Systems  Constitutional: Positive for fatigue. Negative for chills and fever.  HENT: Negative for congestion.   Eyes: Negative for pain.  Respiratory: Negative for cough and shortness of breath.   Cardiovascular:  Negative for chest pain and leg swelling.  Gastrointestinal: Positive for abdominal pain and diarrhea. Negative for nausea and vomiting.  Genitourinary: Negative for dysuria.  Musculoskeletal: Negative for myalgias.  Skin: Negative for rash.  Neurological: Positive for headaches. Negative for dizziness.    Physical Exam Updated Vital Signs BP (!) 144/100 (BP Location: Left Arm)   Pulse 72   Temp 98.1 F (36.7 C) (Oral)   Resp 17   Ht  (1.702 m)   Wt 37.7 kg   LMP 11/03/2011   SpO2 100%   BMI 13.02 kg/m   Physical Exam Vitals and nursing note reviewed.  Constitutional:      General: She is not in acute distress.    Comments: Patient is thin but well-appearing 53 year old female appears stated age resting comfortably in bed in no acute distress is not ill-appearing and nontoxic nondiaphoretic.  Pleasant, able answer questions appropriately and follow commands  HENT:     Head: Normocephalic and atraumatic.     Nose: Nose normal.  Eyes:     General: No scleral icterus. Cardiovascular:     Rate and Rhythm: Normal rate and regular rhythm.     Pulses: Normal pulses.     Heart sounds: Normal heart sounds.  Pulmonary:     Effort: Pulmonary effort is normal. No respiratory distress.     Breath sounds: No wheezing.     Comments: No increased work of breathing speaking full sentences. Abdominal:     Palpations: Abdomen is soft.     Tenderness: There is no abdominal tenderness.  Musculoskeletal:     Cervical back: Normal range of motion.     Right lower leg: No edema.     Left lower leg: No edema.  Skin:    General: Skin is warm and dry.     Capillary Refill: Capillary refill takes less than 2 seconds.  Neurological:     Mental Status: She is alert. Mental status is at baseline.     Comments: Alert and oriented to self, place, time and event.   Speech is fluent, clear without dysarthria or dysphasia.   Strength 5/5 in upper/lower extremities  Sensation intact in  upper/lower extremities   CN I not tested  CN II grossly intact visual fields bilaterally. Did not visualize posterior eye.   CN III, IV, VI PERRLA and EOMs intact bilaterally  CN V Intact sensation to sharp and light touch to the face  CN VII facial movements symmetric  CN VIII not tested  CN IX, X no uvula deviation, symmetric rise of soft palate  CN XI 5/5 SCM and trapezius strength bilaterally  CN XII Midline tongue protrusion, symmetric L/R movements   Psychiatric:        Mood and Affect: Mood normal.        Behavior: Behavior normal.     ED Results / Procedures / Treatments   Labs (all labs ordered are listed, but only abnormal results are displayed) Labs Reviewed  CBC WITH DIFFERENTIAL/PLATELET - Abnormal; Notable for the following components:      Result Value   MCH 34.3 (*)    All other components within normal limits  COMPREHENSIVE METABOLIC PANEL - Abnormal; Notable for the following components:   Sodium 133 (*)    Potassium 3.3 (*)    Chloride 94 (*)    BUN 29 (*)    Creatinine, Ser 1.02 (*)    AST 46 (*)    All other components within normal limits  URINALYSIS, ROUTINE W REFLEX MICROSCOPIC - Abnormal; Notable for the following components:   Ketones, ur 40 (*)    All other components within normal limits  SARS CORONAVIRUS 2 AG (30 MIN TAT)  NOVEL CORONAVIRUS, NAA (HOSP ORDER, SEND-OUT TO REF LAB; TAT 18-24 HRS)  LIPASE, BLOOD    EKG None  Radiology No results found.  Procedures Procedures (including critical care time)  Medications Ordered in ED Medications  0.9 %  sodium chloride infusion ( Intravenous New Bag/Given 08/15/19 0945)  metoCLOPramide (REGLAN) injection 10 mg (10 mg Intravenous Given 08/15/19 0947)  ketorolac (TORADOL) 15 MG/ML injection 15 mg (15  mg Intravenous Given 08/15/19 0949)  diphenhydrAMINE (BENADRYL) injection 25 mg (25 mg Intravenous Given 08/15/19 0946)    ED Course  I have reviewed the triage vital signs and the nursing  notes.  Pertinent labs & imaging results that were available during my care of the patient were reviewed by me and considered in my medical decision making (see chart for details).  Clinical Course as of Aug 14 1106  Sun Aug 15, 2019  1031 BUN and creatinine mildly elevated BUN significantly more evident and creatinine consistent with prerenal AKI.  Consistent with patient's diarrhea.  Sodium potassium and chloride low but only mildly so.  Patient is not having any severe electrolyte abnormality symptoms such as spasms or severe weakness.  Comprehensive metabolic panel(!) [WF]  1032 No anemia or leukocytosis  CBC with Differential(!) [WF]  1032 No evidence of infection.  Some ketones present likely due to starvation state as she has had decreased oral intake.  Urinalysis, Routine w reflex microscopic(!) [WF]  1032 Covid negative.  Will swab at discharge with 6 to 24-hour test.  SARS Coronavirus 2 Ag (30 min TAT) - Urine, Clean Catch [WF]    Clinical Course User Index [WF] Gailen ShelterFondaw, Nashira Mcglynn S, PA   MDM Rules/Calculators/A&P                      Patient is 53 year old female presented today with headache, abdominal aches and diarrhea.  Diarrhea --patient has any blood in her stool and is afebrile with no medications.  Doubt this is infectious diarrhea as patient has no recent out of country travel, no recent antibiotic usage, and no vital sign abnormalities.   Abdominal pain --patient is abdominal pain is vague, achy, generalized and does not appear to be a strong concern of hers.  Causes of generalized abdominal pain include DKA, AAA, mesenteric ischemia, SBO/LBO, UTI, IBD, IBS, PUD.  Doubt GYN etiology of abdominal pain as she denies any pelvic pain, vaginal discharge, vaginal pain vaginal bleeding.  Denies any concern for sexually transmitted diseases.  Doubt ovarian torsion as patient's abdominal pain is mild accompanied with diarrhea.  Doubt PID as she has had no vaginal discharge is no  concern for sexually transmitted diseases.  Headache --patient has a history of migraines and states that this is similar in symptom presentation as well as location and character to previous migraines of hers.  She has had extensive work-up in the past according to patient she has had Botox injections.  She [place] and [facility]  Patient does not feel this headache is unusual from her normal migraine.  Doubt subarachnoid hemorrhage, meningitis, temporal arteritis, glaucoma.  Patient has neurologically reassuring physical exam.  Plan is to provide patient of headache cocktail which she states is discomfort in the past.  And reassess.  Also doubt carbon monoxide poisoning patient states that no people in her living space has had similar headaches.   See lab interpretations above.  On reexamination patient appears much improved.  States that she is ready to go home.  I think this is reasonable at this time.  Patient given strict return precautions.  Suspect that patient's headache is likely historic migraines or perhaps could be due to dehydration as patient has had diarrhea.  Mild electrolyte abnormalities noted.  Discussed with patient.  Connie Black [place] was evaluated in Emergency Department on 08/15/2019 for the symptoms described  in the history of present illness. She was evaluated in the context of the global COVID-19 pandemic, which necessitated consideration that the patient might be at risk for infection with the SARS-CoV-2 virus that causes COVID-19. Institutional protocols and algorithms that pertain to the evaluation of patients at risk for COVID-19 are in a state of rapid change based on information released by regulatory bodies including the CDC and federal and state organizations. These policies and algorithms were followed during the patient's care in the ED.   This patient appears  reasonably screened and I doubt any other medical condition requiring further workup, evaluation, or treatment in the ED at this time prior to discharge.   Patient's vitals are WNL apart from vital sign abnormalities discussed above, patient is in NAD, and able to ambulate in the ED at their baseline. Pain has been managed or a plan has been made for home management and has no complaints prior to discharge. Patient is comfortable with above plan and is stable for discharge at this time. All questions were answered prior to disposition. Results from the ER workup discussed with the patient face to face and all questions answered to the best of my ability. The patient is safe for discharge with strict return precautions. Patient appears safe for discharge with appropriate follow-up. Conveyed my impression with the patient and they voiced understanding and are agreeable to plan.   An After Visit Summary was printed and given to the patient.  Portions of this note were generated with [employer]. Dictation errors may occur despite best attempts at proofreading.    Final Clinical Impression(s) / ED Diagnoses Final diagnoses:  Diarrhea, unspecified type  Bad headache  Dehydration    Rx / DC Orders ED Discharge Orders    None       Tedd Sias, [place] 08/15/19 [place], DO 08/15/19 1236

## 2019-08-15 NOTE — Discharge Instructions (Signed)
Please follow-up with your migraine specialist.  Please stay plenty of water and eat food as normal.  Stay hydrated.  You may use sumatriptan at home or other migraine medications that you have been prescribed.

## 2019-08-16 LAB — NOVEL CORONAVIRUS, NAA (HOSP ORDER, SEND-OUT TO REF LAB; TAT 18-24 HRS): SARS-CoV-2, NAA: NOT DETECTED

## 2019-10-06 ENCOUNTER — Other Ambulatory Visit: Payer: Self-pay

## 2019-10-06 ENCOUNTER — Encounter (HOSPITAL_COMMUNITY): Payer: Self-pay | Admitting: Emergency Medicine

## 2019-10-06 ENCOUNTER — Emergency Department (HOSPITAL_COMMUNITY)
Admission: EM | Admit: 2019-10-06 | Discharge: 2019-10-06 | Disposition: A | Discharge status: Home / Self Care | Payer: No Typology Code available for payment source | Attending: Emergency Medicine | Admitting: Emergency Medicine

## 2019-10-06 DIAGNOSIS — Z20822 Contact with and (suspected) exposure to covid-19: Secondary | ICD-10-CM | POA: Insufficient documentation

## 2019-10-06 DIAGNOSIS — R440 Auditory hallucinations: Secondary | ICD-10-CM

## 2019-10-06 DIAGNOSIS — J45909 Unspecified asthma, uncomplicated: Secondary | ICD-10-CM | POA: Diagnosis not present

## 2019-10-06 DIAGNOSIS — Z87891 Personal history of nicotine dependence: Secondary | ICD-10-CM | POA: Insufficient documentation

## 2019-10-06 DIAGNOSIS — I10 Essential (primary) hypertension: Secondary | ICD-10-CM | POA: Diagnosis not present

## 2019-10-06 DIAGNOSIS — F1490 Cocaine use, unspecified, uncomplicated: Secondary | ICD-10-CM | POA: Diagnosis not present

## 2019-10-06 DIAGNOSIS — F319 Bipolar disorder, unspecified: Secondary | ICD-10-CM | POA: Diagnosis not present

## 2019-10-06 DIAGNOSIS — Z79899 Other long term (current) drug therapy: Secondary | ICD-10-CM | POA: Diagnosis not present

## 2019-10-06 DIAGNOSIS — F1994 Other psychoactive substance use, unspecified with psychoactive substance-induced mood disorder: Secondary | ICD-10-CM

## 2019-10-06 DIAGNOSIS — F1910 Other psychoactive substance abuse, uncomplicated: Secondary | ICD-10-CM

## 2019-10-06 LAB — CBC
HCT: 35.2 % — ABNORMAL LOW (ref 36.0–46.0)
Hemoglobin: 12.2 g/dL (ref 12.0–15.0)
MCH: 34.3 pg — ABNORMAL HIGH (ref 26.0–34.0)
MCHC: 34.7 g/dL (ref 30.0–36.0)
MCV: 98.9 fL (ref 80.0–100.0)
Platelets: 222 10*3/uL (ref 150–400)
RBC: 3.56 MIL/uL — ABNORMAL LOW (ref 3.87–5.11)
RDW: 12.6 % (ref 11.5–15.5)
WBC: 7.8 10*3/uL (ref 4.0–10.5)
nRBC: 0 % (ref 0.0–0.2)

## 2019-10-06 LAB — COMPREHENSIVE METABOLIC PANEL
ALT: 23 U/L (ref 0–44)
AST: 29 U/L (ref 15–41)
Albumin: 4.9 g/dL (ref 3.5–5.0)
Alkaline Phosphatase: 49 U/L (ref 38–126)
Anion gap: 14 (ref 5–15)
BUN: 26 mg/dL — ABNORMAL HIGH (ref 6–20)
CO2: 25 mmol/L (ref 22–32)
Calcium: 9.9 mg/dL (ref 8.9–10.3)
Chloride: 95 mmol/L — ABNORMAL LOW (ref 98–111)
Creatinine, Ser: 0.93 mg/dL (ref 0.44–1.00)
GFR calc Af Amer: >60 mL/min (ref >60)
GFR calc non Af Amer: >60 mL/min (ref >60)
Glucose, Bld: 84 mg/dL (ref 70–99)
Potassium: 3.1 mmol/L — ABNORMAL LOW (ref 3.5–5.1)
Sodium: 134 mmol/L — ABNORMAL LOW (ref 135–145)
Total Bilirubin: 0.3 mg/dL (ref 0.3–1.2)
Total Protein: 7.4 g/dL (ref 6.5–8.1)

## 2019-10-06 LAB — I-STAT BETA HCG BLOOD, ED (MC, WL, AP ONLY): I-stat hCG, quantitative: 7.2 m[IU]/mL — ABNORMAL HIGH (ref <5)

## 2019-10-06 LAB — RESPIRATORY PANEL BY RT PCR (FLU A&B, COVID)
Influenza A by PCR: NEGATIVE
Influenza B by PCR: NEGATIVE
SARS Coronavirus 2 by RT PCR: NEGATIVE

## 2019-10-06 LAB — RAPID URINE DRUG SCREEN, HOSP PERFORMED
Amphetamines: NOT DETECTED
Barbiturates: NOT DETECTED
Benzodiazepines: POSITIVE — AB
Cocaine: POSITIVE — AB
Opiates: NOT DETECTED
Tetrahydrocannabinol: POSITIVE — AB

## 2019-10-06 LAB — ETHANOL: Alcohol, Ethyl (B): 37 mg/dL — ABNORMAL HIGH (ref <10)

## 2019-10-06 MED ORDER — SUMATRIPTAN SUCCINATE 50 MG PO TABS
100.0000 mg | ORAL_TABLET | Freq: Once | ORAL | Status: AC
  Administered 2019-10-06: 100 mg via ORAL
  Filled 2019-10-06: qty 2

## 2019-10-06 MED ORDER — SENNOSIDES-DOCUSATE SODIUM 8.6-50 MG PO TABS
1.0000 | ORAL_TABLET | Freq: Two times a day (BID) | ORAL | Status: DC
  Administered 2019-10-06: 1 via ORAL
  Filled 2019-10-06: qty 1

## 2019-10-06 MED ORDER — POTASSIUM CHLORIDE CRYS ER 20 MEQ PO TBCR
40.0000 meq | EXTENDED_RELEASE_TABLET | Freq: Once | ORAL | Status: AC
  Administered 2019-10-06: 40 meq via ORAL
  Filled 2019-10-06: qty 2

## 2019-10-06 MED ORDER — HYDROCHLOROTHIAZIDE 25 MG PO TABS
25.0000 mg | ORAL_TABLET | Freq: Every day | ORAL | Status: DC
  Administered 2019-10-06: 10:00:00 25 mg via ORAL
  Filled 2019-10-06: qty 1

## 2019-10-06 MED ORDER — QUETIAPINE FUMARATE 200 MG PO TABS: 200.0000 mg | ORAL_TABLET | Freq: Two times a day (BID) | ORAL | 0 refills | Status: DC

## 2019-10-06 MED ORDER — QUETIAPINE FUMARATE 100 MG PO TABS
200.0000 mg | ORAL_TABLET | Freq: Two times a day (BID) | ORAL | Status: DC
  Administered 2019-10-06: 200 mg via ORAL
  Filled 2019-10-06: qty 2

## 2019-10-06 MED ORDER — LISINOPRIL-HYDROCHLOROTHIAZIDE 20-25 MG PO TABS: 1.0000 | ORAL_TABLET | Freq: Every day | ORAL | Status: DC

## 2019-10-06 MED ORDER — LISINOPRIL 20 MG PO TABS
20.0000 mg | ORAL_TABLET | Freq: Every day | ORAL | Status: DC
  Administered 2019-10-06: 20 mg via ORAL
  Filled 2019-10-06: qty 1

## 2019-10-06 MED ORDER — DICLOFENAC SODIUM 75 MG PO TBEC
75.0000 mg | DELAYED_RELEASE_TABLET | Freq: Once | ORAL | Status: AC
  Administered 2019-10-06: 75 mg via ORAL
  Filled 2019-10-06: qty 1

## 2019-10-06 MED ORDER — LORAZEPAM 1 MG PO TABS
1.0000 mg | ORAL_TABLET | Freq: Four times a day (QID) | ORAL | Status: DC | PRN
  Administered 2019-10-06: 1 mg via ORAL
  Filled 2019-10-06: qty 1

## 2019-10-06 NOTE — ED Triage Notes (Addendum)
Patient complains of hearing voices and seeing things. Denies SI or HI at this time. Tearful and quiet upon presentation, cluthing a stuffed animal. Unable to describe the voices, also mentions missing 2 says if her ativan.

## 2019-10-06 NOTE — ED Notes (Signed)
Patient stated she did not want to wait for the doctor to reassess her, she wanted to leave and her ride was almost here. She said, "they can't do anything for me, I am leaving."

## 2019-10-06 NOTE — BH Assessment (Signed)
Tele Assessment Note   Patient Name: Connie Black MRN: [id number] Referring Physician: EDP Location of Patient: [facility] Location of Provider: Behavioral Health TTS Department  Connie Black is a 54 y.o. female who presented to [facility] on voluntary basis with complaint of hallucination following loss of her psychotropic medication (Ativan and Serqouel).  Pt is a resident of [place], and she is visiting her fiance and her children in [place].  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  Pt reported that as a result, she has started experiencing hallucinations.   Pt reported that she is experiencing auditory hallucinations -- voices encouraging her to harm herself.  Pt denied any actual suicidal ideation.  She reported that she has attempted suicide 10 times int he past, and that last attempt was ''years ago.''  Pt also reported that she consumed 1/2 a pint of liquor on 10/05/19 and $20 worth of cocaine on 10/04/19 in an attempt to treat the auditory hallucinations.  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  Pt reported that without medication, she has difficulty sleeping, and she stated that she has not slept for three days.    During assessment, Pt presented as alert and oriented.  She had good eye contact and was cooperative.  Pt was dressed in scrubs, and she appeared appropriately groomed.  Pt's mood was reported as preoccupied.  Affect was mood-congruent.  Pt's speech was normal in rate, rhythm and volume.  Thought processes were within normal range, and thought content was logical and goal-oriented.  There was no evidence of delusion.  Pt's memory and concentration were intact.  Insight, judgment, and impulse control were fair.  Consulted S. Rankin, NP, who determined that Pt does not meet inpatient criteria.  Diagnosis: Bipolar I; DID  Past  Medical History:  Past Medical History:  Diagnosis Date  . Anxiety   . Asthma   . Bipolar 1 disorder (HCC)   . Eating disorder   . Hypertension   . Lumbar radiculopathy, chronic   . Migraines     Past Surgical History:  Procedure Laterality Date  . "Release fluid from Brain" for Migraines June 2013    . ABDOMINAL HYSTERECTOMY    . LAPAROSCOPY Right 08/20/2013   Procedure: LAPAROSCOPY OPERATIVE;  Surgeon: Catalina Antigua, MD;  Location: [facility];  Service: Gynecology;  Laterality: Right;    Family History:  Family History  Problem Relation Age of Onset  . Cancer Other     Social History:  reports that she quit smoking about 6 years ago. She smoked 0.50 packs per day. She has never used smokeless tobacco. She reports current alcohol use. She reports current drug use. Drug: Cocaine.  Additional Social History:  Alcohol / Drug Use Pain Medications: See MAR Prescriptions: See MAR Over the Counter: See MAR History of alcohol / drug use?: Yes Substance #1 Name of Substance 1: Alcohol Substance #2 Name of Substance 2: Cocaine 2 - Amount (size/oz): $20 2 - Frequency: Episodic 2 - Last Use / Amount: 10/04/19  CIWA: CIWA-Ar BP: (!) 137/95 Pulse Rate: 100 COWS:    Allergies:  Allergies  Allergen Reactions  . Morphine Rash    Home Medications: (Not in a hospital admission)   OB/GYN Status:  Patient's last menstrual period was 11/03/2011.  General Assessment Data Location of Assessment: [facility]  TTS Assessment: In system Is this a Tele or Face-to-Face Assessment?: Tele Assessment Is this an Initial Assessment or a Re-assessment for this encounter?: Initial Assessment Patient Accompanied by:: N/A Language Other than English: No Living Arrangements: Other (Comment) What gender do you identify as?: Female Marital status: Long term relationship Pregnancy Status: No Living Arrangements: Alone(Currently visiting fiance and children in [place]) Can pt return to current living  arrangement?: Yes Admission Status: Voluntary Is patient capable of signing voluntary admission?: Yes Referral Source: Self/Family/Friend Insurance type: [employer] [place]     Crisis Care Plan Living Arrangements: Alone(Currently visiting fiance and children in [place]) Name of Psychiatrist: [place] Provider Name of Therapist: [place] Provider  Education Status Is patient currently in school?: No Is the patient employed, unemployed or receiving disability?: Receiving disability income  Risk to self with the past 6 months Suicidal Ideation: No Has patient been a risk to self within the past 6 months prior to admission? : No Suicidal Intent: No Has patient had any suicidal intent within the past 6 months prior to admission? : No Is patient at risk for suicide?: No Suicidal Plan?: No Has patient had any suicidal plan within the past 6 months prior to admission? : No Access to Means: No What has been your use of drugs/alcohol within the last 12 months?: 1/2 pint liquor; $20 worth of cocaine(within last two days) Previous Attempts/Gestures: Yes How many times?: 10 Other Self Harm Risks: Recent substance use Triggers for Past Attempts: Unknown Intentional Self Injurious Behavior: Cutting Comment - Self Injurious Behavior: Hx of cutting, but not recently Family Suicide History: Unknown Recent stressful life event(s): Other (Comment)(Lost her psychotropic medication) Persecutory voices/beliefs?: Yes Depression: Yes Depression Symptoms: Despondent, Insomnia, Feeling angry/irritable, Loss of interest in usual pleasures Substance abuse history and/or treatment for substance abuse?: Yes Suicide prevention information given to non-admitted patients: Not applicable  Risk to Others within the past 6 months Homicidal Ideation: No Does patient have any lifetime risk of violence toward others beyond the six months prior to admission? : No Thoughts of Harm to Others: No Current Homicidal Intent:  No Current Homicidal Plan: No Access to Homicidal Means: No History of harm to others?: No Assessment of Violence: None Noted Does patient have access to weapons?: No Criminal Charges Pending?: No Does patient have a court date: No Is patient on probation?: No  Psychosis Hallucinations: Auditory Delusions: None noted  Mental Status Report Appearance/Hygiene: In scrubs, Unremarkable(Holding a stuffed animal) Eye Contact: Good Motor Activity: Freedom of movement, Unremarkable Speech: Logical/coherent Level of Consciousness: Alert Mood: Preoccupied, Helpless Affect: Appropriate to circumstance Anxiety Level: None Thought Processes: Relevant, Coherent Judgement: Partial Orientation: Person, Place, Situation, Time Obsessive Compulsive Thoughts/Behaviors: None  Cognitive Functioning Concentration: Normal Memory: Remote Intact, Recent Intact Is patient IDD: No Insight: Fair Impulse Control: Fair Appetite: Fair Have you had any weight changes? : No Change Sleep: Decreased Total Hours of Sleep: (No sleep over three nights) Vegetative Symptoms: None  ADLScreening [facility] Assessment Services) Patient's cognitive ability adequate to safely complete daily activities?: Yes Patient able to express need for assistance with ADLs?: Yes Independently performs ADLs?: Yes (appropriate for developmental age)  Prior Inpatient Therapy Prior Inpatient Therapy: No  Prior Outpatient Therapy Prior Outpatient Therapy: Yes Prior Therapy Dates: Current Prior Therapy Facilty/Provider(s): Outpatient provider in [facility] Reason for Treatment: Bipolar I, DID Does patient have an ACCT team?: No Does patient have Intensive In-House Services?  : No Does patient have [facility] services? : No Does patient have P4CC  services?: No  ADL Screening (condition at time of admission) Patient's cognitive ability adequate to safely complete daily activities?: Yes Is the patient deaf or have difficulty hearing?:  No Does the patient have difficulty seeing, even when wearing glasses/contacts?: No Does the patient have difficulty concentrating, remembering, or making decisions?: No Patient able to express need for assistance with ADLs?: Yes Does the patient have difficulty dressing or bathing?: No Independently performs ADLs?: Yes (appropriate for developmental age) Does the patient have difficulty walking or climbing stairs?: No Weakness of Legs: None Weakness of Arms/Hands: None  Home Assistive Devices/Equipment Home Assistive Devices/Equipment: None  Therapy Consults (therapy consults require a physician order) PT Evaluation Needed: No OT Evalulation Needed: No SLP Evaluation Needed: No Abuse/Neglect Assessment (Assessment to be complete while patient is alone) Abuse/Neglect Assessment Can Be Completed: Yes Physical Abuse: Denies Verbal Abuse: Denies Sexual Abuse: Denies Exploitation of patient/patient's resources: Denies Self-Neglect: Denies Values / Beliefs Cultural Requests During Hospitalization: None Spiritual Requests During Hospitalization: None Consults Spiritual Care Consult Needed: No Transition of Care Team Consult Needed: No Advance Directives (For Healthcare) Does Patient Have a Medical Advance Directive?: No          Disposition:  Disposition Initial Assessment Completed for this Encounter: Yes Disposition of Patient: Discharge  This service was provided via telemedicine using a 2-way, interactive audio and video technology.  Names of all persons participating in this telemedicine service and their role in this encounter. Name: Connie Black Role: Patient             Marlowe Aschoff 10/06/2019 10:05 AM

## 2019-10-06 NOTE — Discharge Instructions (Signed)
For your needs, you are advised to follow up with an outpatient provider.  Contact one of the providers listed below at your earliest opportunity to ask about scheduling an intake appointment:       [facility]      [address]      [place], [place] [address]      [phone]      New patients are seen at their walk-in clinic.  Walk-in hours are Monday - Friday from 8:30 am - 12:00 pm, and from 1:00 pm - 2:30 pm.  Walk-in patients are seen on a first come, first served basis, so try to arrive as early as possible for the best chance of being seen the same day.       [facility]      [address] [place], [place] [address]      [phone]

## 2019-10-06 NOTE — ED Provider Notes (Signed)
[facility]-EMERGENCY DEPT Provider Note   CSN: [id number] Arrival date & time: 10/06/19  6659     History Chief Complaint  Patient presents with  . Hallucinations    Connie Black is a 54 y.o. female.  HPI   Patient presents to the ED for evaluation of hallucinations.  Patient has a history of mental health problems.  Patient states she lives in [place] but has been visiting family here for the last couple of months.  Patient states she feels like she is having trouble because she ran out of her Ativan.  She has been taking her other mental health medications.  Patient also admits that she did drink alcohol and use some cocaine last evening.  She had been sober for a number of years prior to that.  The voices are upsetting to her.  Patient will not tell me specifically what they are saying.  Patient states she does not want to hurt anyone.  She denies specific suicidal ideation but she is very tearful and upset.  Past Medical History:  Diagnosis Date  . Anxiety   . Asthma   . Bipolar 1 disorder (HCC)   . Eating disorder   . Hypertension   . Lumbar radiculopathy, chronic   . Migraines     Patient Active Problem List   Diagnosis Date Noted  . Lumbar radiculopathy 09/24/2016  . S/P hysterectomy 12/24/2013  . Cellulitis and abscess of buttock 12/24/2013  . Ovarian torsion 08/21/2013  . Back pain 06/14/2011  . Anorexia 06/14/2011  . ASTHMA, INTERMITTENT 04/21/2010  . MIGRAINE HEADACHE 09/06/2009  . HERNIATED LUMBOSACRAL DISC 09/06/2009  . SUBSTANCE ABUSE 05/26/2008  . BIPOLAR DISORDER 10/16/2006  . TOBACCO DEPENDENCE 10/16/2006  . HYPERTENSION, BENIGN SYSTEMIC 10/16/2006    Past Surgical History:  Procedure Laterality Date  . "Release fluid from Brain" for Migraines June 2013    . ABDOMINAL HYSTERECTOMY    . LAPAROSCOPY Right 08/20/2013   Procedure: LAPAROSCOPY OPERATIVE;  Surgeon: Catalina Antigua, MD;  Location: [facility];  Service: Gynecology;   Laterality: Right;     OB History    Gravida  3   Para  3   Term  1   Preterm  2   AB      Living        SAB      TAB      Ectopic      Multiple      Live Births              Family History  Problem Relation Age of Onset  . Cancer Other     Social History   Tobacco Use  . Smoking status: Former Smoker    Packs/day: 0.50    Quit date: 08/09/2013    Years since quitting: 6.1  . Smokeless tobacco: Never Used  . Tobacco comment: is trying to quitt  Substance Use Topics  . Alcohol use: Yes    Comment: BAC .37  . Drug use: Yes    Types: Cocaine    Comment: Episodic use of cocaine    Home Medications Prior to Admission medications   Medication Sig Start Date End Date Taking? Authorizing Provider  cyclobenzaprine (FLEXERIL) 5 MG tablet Take 1-2 tablets (5-10 mg total) by mouth 3 (three) times daily as needed for muscle spasms. 09/24/16  Yes Tarry Kos, MD  diphenhydrAMINE (BENADRYL) 25 MG tablet Take 25 mg by mouth every 6 (six) hours as needed.  Yes [provider]  lisinopril-hydrochlorothiazide (ZESTORETIC) 20-25 MG tablet Take 1 tablet by mouth daily.   Yes [provider]  LORazepam (ATIVAN) 1 MG tablet Take 1 mg by mouth 2 (two) times daily.   Yes [provider]  naratriptan (AMERGE) 2.5 MG tablet Take 2.5 mg by mouth as needed for migraine. Take one (1) tablet at onset of headache; if returns or does not resolve, may repeat after 4 hours; do not exceed five (5) mg in 24 hours.   Yes [provider]  QUEtiapine (SEROQUEL) 200 MG tablet Take 200 mg by mouth 2 (two) times daily.    Yes [provider]  senna-docusate (SENOKOT-S) 8.6-50 MG tablet Take 1 tablet by mouth 2 (two) times daily.   Yes [provider]  cephALEXin (KEFLEX) 500 MG capsule Take 1 capsule (500 mg total) by mouth 4 (four) times daily. Patient not taking: Reported on 09/24/2016 08/12/16   Arthor Captain, PA-C  diazepam (VALIUM) 5  MG tablet Take 1-2 tablets (5-10 mg total) by mouth every 6 (six) hours as needed for muscle spasms. Patient not taking: Reported on 10/06/2019 09/24/16   Tarry Kos, MD  diclofenac (VOLTAREN) 75 MG EC tablet Take 1 tablet (75 mg total) by mouth 2 (two) times daily. Patient not taking: Reported on 10/06/2019 09/24/16   Tarry Kos, MD  HYDROcodone-acetaminophen (NORCO/VICODIN) 5-325 MG tablet Take 1 tablet by mouth every 6 (six) hours as needed for severe pain. Patient not taking: Reported on 10/06/2019 03/12/18   Caccavale, Sophia, PA-C  methocarbamol (ROBAXIN) 500 MG tablet Take 1 tablet (500 mg total) by mouth 2 (two) times daily. Patient not taking: Reported on 09/24/2016 09/06/16   [name], PA-C  naproxen (NAPROSYN) 375 MG tablet Take 1 tablet (375 mg total) by mouth 2 (two) times daily. Patient not taking: Reported on 09/24/2016 08/12/16   Arthor Captain, PA-C  ondansetron (ZOFRAN) 4 MG tablet Take 1 tablet (4 mg total) by mouth every 8 (eight) hours as needed for nausea or vomiting. Patient not taking: Reported on 10/06/2019 03/12/18   Caccavale, Sophia, PA-C  predniSONE (DELTASONE) 20 MG tablet 2 tabs po daily x 4 days Patient not taking: Reported on 09/24/2016 09/06/16   [name], PA-C  traMADol (ULTRAM) 50 MG tablet Take 1 tablet (50 mg total) by mouth every 6 (six) hours as needed for moderate pain or severe pain. Patient not taking: Reported on 09/24/2016 09/06/16   [name], PA-C    Allergies    Morphine  Review of Systems   Review of Systems  Constitutional: Negative for fever.  Respiratory: Negative for shortness of breath.   Cardiovascular: Negative for chest pain.  Gastrointestinal: Negative for abdominal pain.    Physical Exam Updated Vital Signs BP (!) 137/95 (BP Location: Left Arm)   Pulse 100   Temp 98.6 F (37 C) (Oral)   Resp 19   LMP 11/03/2011   SpO2 100%   Physical Exam Vitals and nursing note reviewed.  Constitutional:      General: She is not in acute  distress.    Appearance: She is well-developed.  HENT:     Head: Normocephalic and atraumatic.     Right Ear: External ear normal.     Left Ear: External ear normal.  Eyes:     General: No scleral icterus.       Right eye: No discharge.        Left eye: No discharge.  Conjunctiva/sclera: Conjunctivae normal.  Neck:     Trachea: No tracheal deviation.  Cardiovascular:     Rate and Rhythm: Normal rate and regular rhythm.  Pulmonary:     Effort: Pulmonary effort is normal. No respiratory distress.     Breath sounds: Normal breath sounds. No stridor. No wheezing or rales.  Abdominal:     General: Bowel sounds are normal. There is no distension.     Palpations: Abdomen is soft.     Tenderness: There is no abdominal tenderness. There is no guarding or rebound.  Musculoskeletal:        General: No tenderness.     Cervical back: Neck supple.  Skin:    General: Skin is warm and dry.     Findings: No rash.  Neurological:     Mental Status: She is alert.     Cranial Nerves: No cranial nerve deficit (no facial droop, extraocular movements intact, no slurred speech).     Sensory: No sensory deficit.     Motor: No abnormal muscle tone or seizure activity.     Coordination: Coordination normal.  Psychiatric:        Attention and Perception: She perceives auditory hallucinations.        Mood and Affect: Mood is anxious. Affect is tearful.        Speech: Speech is not rapid and pressured or slurred.        Behavior: Behavior is withdrawn. Behavior is not aggressive or hyperactive.        Thought Content: Thought content does not include homicidal or suicidal plan.     ED Results / Procedures / Treatments   Labs (all labs ordered are listed, but only abnormal results are displayed) Labs Reviewed  COMPREHENSIVE METABOLIC PANEL - Abnormal; Notable for the following components:      Result Value   Sodium 134 (*)    Potassium 3.1 (*)    Chloride 95 (*)    BUN 26 (*)    All other  components within normal limits  ETHANOL - Abnormal; Notable for the following components:   Alcohol, Ethyl (B) 37 (*)    All other components within normal limits  CBC - Abnormal; Notable for the following components:   RBC 3.56 (*)    HCT 35.2 (*)    MCH 34.3 (*)    All other components within normal limits  RAPID URINE DRUG SCREEN, HOSP PERFORMED - Abnormal; Notable for the following components:   Cocaine POSITIVE (*)    Benzodiazepines POSITIVE (*)    Tetrahydrocannabinol POSITIVE (*)    All other components within normal limits  I-STAT BETA HCG BLOOD, ED ([facility], [facility], [facility] ONLY) - Abnormal; Notable for the following components:   I-stat hCG, quantitative 7.2 (*)    All other components within normal limits  RESPIRATORY PANEL BY RT PCR (FLU A&B, COVID)    EKG EKG Interpretation  Date/Time:  Wednesday October 06 2019 07:58:24 EST Ventricular Rate:  92 PR Interval:    QRS Duration: 92 QT Interval:  386 QTC Calculation: 478 R Axis:   85 Text Interpretation: Sinus rhythm Consider left ventricular hypertrophy No significant change since last tracing Confirmed by Dorie Rank [phone]) on 10/06/2019 8:09:35 AM   Radiology No results found.  Procedures Procedures (including critical care time)  Medications Ordered in ED Medications  potassium chloride SA (KLOR-CON) CR tablet 40 mEq (has no administration in time range)  lisinopril-hydrochlorothiazide (ZESTORETIC) 20-25 MG per tablet 1 tablet (has no administration  in time range)  QUEtiapine (SEROQUEL) tablet 200 mg (has no administration in time range)  senna-docusate (Senokot-S) tablet 1 tablet (has no administration in time range)  LORazepam (ATIVAN) tablet 1 mg (has no administration in time range)    ED Course  I have reviewed the triage vital signs and the nursing notes.  Pertinent labs & imaging results that were available during my care of the patient were reviewed by me and considered in my medical decision making (see  chart for details).  Clinical Course as of Oct 05 918  Wed Oct 06, 2019  0807 Istat hcg noted.  Slightly increased.  Not consistent with pregnancy, doubt clinically significant   [JK]  0915 Labs reviewed.  Elevated etoh and positive UDS   [JK]    Clinical Course User Index [JK] Linwood Dibbles, MD   MDM Rules/Calculators/A&P                      Pt presents with hallucinations, tearfulness.  Hx of bipolar disorder.  Pt admits to recent cocaine use.  UDS positive for cocaine, benzo, THC.  ETOH noted.  Pt is medically stable.  Will consult tts to assess if pt will benefit from inpatient stabilization.  Final Clinical Impression(s) / ED Diagnoses Final diagnoses:  Auditory hallucination  Cocaine use      Linwood Dibbles, MD 10/06/19 [phone]

## 2019-10-06 NOTE — BH Assessment (Signed)
[facility] Assessment Progress Note  Per Shuvon Rankin, FNP, this pt does not require psychiatric hospitalization at this time.  Pt is to be discharged from [facility] with referral information for area substance abuse and mental health treatment providers.  Pt was discharged before referrals could be inserted in pt's discharge instructions.  Referrals for [facility] [place] and for [facility] were entered subsequently.  Doylene Canning, MA Triage Specialist [phone]

## 2019-10-06 NOTE — [facility] Suicide Risk Assessment (Cosign Needed)
Suicide Risk Assessment  Discharge Assessment   [facility] Discharge Suicide Risk Assessment   Principal Problem: Substance induced mood disorder (HCC) Discharge Diagnoses: Principal Problem:   Substance induced mood disorder (HCC) Active Problems:   Polysubstance abuse (HCC)   Total Time spent with patient: 30 minutes  Musculoskeletal: Strength & Muscle Tone: within normal limits Gait & Station: normal Patient leans: N/A  Psychiatric Specialty Exam:   Blood pressure (!) 137/95, pulse 100, temperature 98.6 F (37 C), temperature source Oral, resp. rate 19, last menstrual period 11/03/2011, SpO2 100 %.There is no height or weight on file to calculate BMI.  General Appearance: Casual  Eye Contact::  Good  Speech:  Clear and Coherent and Normal Rate409  Volume:  Normal  Mood:  Irritable  Affect:  Congruent  Thought Process:  Coherent, Goal Directed and Descriptions of Associations: Intact  Orientation:  Full (Time, Place, and Person)  Thought Content:  WDL  Suicidal Thoughts:  No  Homicidal Thoughts:  No  Memory:  Immediate;   Good Recent;   Good  Judgement:  Intact  Insight:  Present  Psychomotor Activity:  Normal  Concentration:  Good  Recall:  Good  Fund of Knowledge:Fair  Language: Good  Akathisia:  No  Handed:  Right  AIMS (if indicated):     Assets:  Communication Skills Desire for Improvement Housing Social Support  Sleep:     Cognition: WNL  ADL's:  Intact   Mental Status Per Nursing Assessment::   On Admission:    Connie Black, 54 y.o., female patient seen via tele psych by this provider, Dr. Lucianne Muss; and chart reviewed on 10/06/19.  On evaluation [name] reports she has been in [place], [place] for 2 months to "get to know my new grandson."  States that she "ran out of my medicine Ativan and Seroquel 3 days ago; and it's been a really bad 3 days."  Patient reports that she lives in [place] with her mother but has been staying with her fiance since she  has been in [place].  States that she will return to [place]. related to that is where she has he doctors and nobody here cares weather she gets her medication.  Patient informed that her Seroquel could be ordered but would need to get information from her provider prescribing her Ativan because it is a prescription that is just not given out in emergency room.  Patient named off several doctors, then stating that the prescription "was just given to me this time; but I need something for my migraine."  Patient then got irritated when asked if she was having thoughts of killing or hurting herself; patient stated "I'm not trying to kill or hurt my damn self; I'm trying to get the hell better.  I just need somebody to give me my prescription; then I'll find a way back to [place].  Patient again informed that a prescription for Seroquel could be given; patient responded "Fuck You."  Then proceeded to call her boyfriend on the phone "Tray, come get me.  I'm going back to [place].  These motherfucker's don't care nothing about my migraine."    During evaluation Nusrat Dolores is alert/oriented x 4; calm/cooperative; and mood is congruent with affect.  She does not appear to be responding to internal/external stimuli or delusional thoughts.  Patient denies suicidal/self-harm/homicidal ideation, psychosis, and paranoia.  Patient answered question appropriately.     Demographic Factors:  Unemployed  Loss Factors: None  Historical Factors: Impulsivity  Risk Reduction Factors:   Sense of responsibility to family, Religious beliefs about death, Living with another person, especially a relative and Positive social support  Continued Clinical Symptoms:  Alcohol/Substance Abuse/Dependencies Previous Psychiatric Diagnoses and Treatments  Cognitive Features That Contribute To Risk:  None    Suicide Risk:  Minimal: No identifiable suicidal ideation.  Patients presenting with no risk factors but with morbid ruminations;  may be classified as minimal risk based on the severity of the depressive symptoms    Plan Of Care/Follow-up recommendations:  Activity:  As tolerated Diet:  Heart healthy  Follow up with current psychiatric provider  Disposition:  Psychiatrically cleared No evidence of imminent risk to self or others at present.   Patient does not meet criteria for psychiatric inpatient admission. Supportive therapy provided about ongoing stressors. Discussed crisis plan, support from social network, calling 911, coming to the Emergency Department, and calling Suicide Hotline.  Chaniece Barbato, NP 10/06/2019, 11:04 AM

## 2019-11-04 ENCOUNTER — Ambulatory Visit: Payer: Medicaid - Out of State | Attending: Internal Medicine

## 2019-11-04 DIAGNOSIS — Z23 Encounter for immunization: Secondary | ICD-10-CM

## 2019-11-04 NOTE — Progress Notes (Signed)
   Covid-19 Vaccination Clinic  Name:  Cyndal Kasson    MRN: [id number] DOB: Jul 06, 1966  11/04/2019  Ms. Chaudhuri was observed post Covid-19 immunization for 15 minutes without incident. She was provided with Vaccine Information Sheet and instruction to access the V-Safe system.   Ms. Mezquita was instructed to call 911 with any severe reactions post vaccine: Marland Kitchen Difficulty breathing  . Swelling of face and throat  . A fast heartbeat  . A bad rash all over body  . Dizziness and weakness   Immunizations Administered    Name Date Dose VIS Date Route   Pfizer COVID-19 Vaccine 11/04/2019  8:28 AM 0.3 mL 07/30/2019 Intramuscular   Manufacturer: [employer], [employer]   Lot: [id number]   NDC: [address]-8

## 2019-11-29 ENCOUNTER — Ambulatory Visit: Payer: Medicaid - Out of State | Attending: Internal Medicine

## 2019-11-29 DIAGNOSIS — Z23 Encounter for immunization: Secondary | ICD-10-CM

## 2019-11-29 NOTE — Progress Notes (Signed)
   Covid-19 Vaccination Clinic  Name:  Connie Black    MRN: [id number] DOB: 1966-06-15  11/29/2019  [name] was observed post Covid-19 immunization for 15 minutes without incident. She was provided with Vaccine Information Sheet and instruction to access the V-Safe system.   [name] was instructed to call 911 with any severe reactions post vaccine: Marland Kitchen Difficulty breathing  . Swelling of face and throat  . A fast heartbeat  . A bad rash all over body  . Dizziness and weakness   Immunizations Administered    Name Date Dose VIS Date Route   Pfizer COVID-19 Vaccine 11/29/2019  8:24 AM 0.3 mL 07/30/2019 Intramuscular   Manufacturer: [employer], [employer]   Lot: [id number]   NDC: [address]-7

## 2020-04-18 ENCOUNTER — Emergency Department (HOSPITAL_BASED_OUTPATIENT_CLINIC_OR_DEPARTMENT_OTHER)
Admission: EM | Admit: 2020-04-18 | Discharge: 2020-04-18 | Disposition: A | Discharge status: Home / Self Care | Payer: No Typology Code available for payment source | Attending: Emergency Medicine | Admitting: Emergency Medicine

## 2020-04-18 ENCOUNTER — Encounter (HOSPITAL_BASED_OUTPATIENT_CLINIC_OR_DEPARTMENT_OTHER): Payer: Self-pay

## 2020-04-18 ENCOUNTER — Other Ambulatory Visit: Payer: Self-pay

## 2020-04-18 DIAGNOSIS — M25559 Pain in unspecified hip: Secondary | ICD-10-CM | POA: Diagnosis present

## 2020-04-18 DIAGNOSIS — Z5321 Procedure and treatment not carried out due to patient leaving prior to being seen by health care provider: Secondary | ICD-10-CM | POA: Diagnosis not present

## 2020-04-18 NOTE — ED Triage Notes (Addendum)
Pt states "I have scoliosis-pain to my tailbone and my hips"-denies injury-to triage in w/c-states she is in town visiting family

## 2020-07-01 ENCOUNTER — Emergency Department (HOSPITAL_COMMUNITY)
Admission: EM | Admit: 2020-07-01 | Discharge: 2020-07-01 | Disposition: A | Discharge status: Home / Self Care | Payer: Medicaid - Out of State | Attending: Emergency Medicine | Admitting: Emergency Medicine

## 2020-07-01 ENCOUNTER — Other Ambulatory Visit: Payer: Self-pay

## 2020-07-01 ENCOUNTER — Emergency Department (HOSPITAL_COMMUNITY): Payer: Medicaid - Out of State

## 2020-07-01 DIAGNOSIS — Z87891 Personal history of nicotine dependence: Secondary | ICD-10-CM | POA: Insufficient documentation

## 2020-07-01 DIAGNOSIS — J4520 Mild intermittent asthma, uncomplicated: Secondary | ICD-10-CM | POA: Insufficient documentation

## 2020-07-01 DIAGNOSIS — R0600 Dyspnea, unspecified: Secondary | ICD-10-CM | POA: Insufficient documentation

## 2020-07-01 DIAGNOSIS — R079 Chest pain, unspecified: Secondary | ICD-10-CM | POA: Insufficient documentation

## 2020-07-01 DIAGNOSIS — I10 Essential (primary) hypertension: Secondary | ICD-10-CM | POA: Diagnosis not present

## 2020-07-01 DIAGNOSIS — Z79899 Other long term (current) drug therapy: Secondary | ICD-10-CM | POA: Insufficient documentation

## 2020-07-01 LAB — BASIC METABOLIC PANEL
Anion gap: 10 (ref 5–15)
BUN: 15 mg/dL (ref 6–20)
CO2: 24 mmol/L (ref 22–32)
Calcium: 9 mg/dL (ref 8.9–10.3)
Chloride: 106 mmol/L (ref 98–111)
Creatinine, Ser: 0.91 mg/dL (ref 0.44–1.00)
GFR, Estimated: >60 mL/min (ref >60)
Glucose, Bld: 76 mg/dL (ref 70–99)
Potassium: 3.6 mmol/L (ref 3.5–5.1)
Sodium: 140 mmol/L (ref 135–145)

## 2020-07-01 LAB — CBC WITH DIFFERENTIAL/PLATELET
Abs Immature Granulocytes: 0.02 10*3/uL (ref 0.00–0.07)
Basophils Absolute: 0 10*3/uL (ref 0.0–0.1)
Basophils Relative: 0 %
Eosinophils Absolute: 0.2 10*3/uL (ref 0.0–0.5)
Eosinophils Relative: 2 %
HCT: 39.4 % (ref 36.0–46.0)
Hemoglobin: 13 g/dL (ref 12.0–15.0)
Immature Granulocytes: 0 %
Lymphocytes Relative: 32 %
Lymphs Abs: 3.1 10*3/uL (ref 0.7–4.0)
MCH: 34.1 pg — ABNORMAL HIGH (ref 26.0–34.0)
MCHC: 33 g/dL (ref 30.0–36.0)
MCV: 103.4 fL — ABNORMAL HIGH (ref 80.0–100.0)
Monocytes Absolute: 0.9 10*3/uL (ref 0.1–1.0)
Monocytes Relative: 10 %
Neutro Abs: 5.5 10*3/uL (ref 1.7–7.7)
Neutrophils Relative %: 56 %
Platelets: 182 10*3/uL (ref 150–400)
RBC: 3.81 MIL/uL — ABNORMAL LOW (ref 3.87–5.11)
RDW: 13.2 % (ref 11.5–15.5)
WBC: 9.8 10*3/uL (ref 4.0–10.5)
nRBC: 0 % (ref 0.0–0.2)

## 2020-07-01 LAB — TROPONIN I (HIGH SENSITIVITY)
Troponin I (High Sensitivity): 9 ng/L (ref <18)
Troponin I (High Sensitivity): 8 ng/L (ref <18)

## 2020-07-01 MED ORDER — KETOROLAC TROMETHAMINE 15 MG/ML IJ SOLN
15.0000 mg | Freq: Once | INTRAMUSCULAR | Status: AC
  Administered 2020-07-01: 15 mg via INTRAVENOUS
  Filled 2020-07-01: qty 1

## 2020-07-01 MED ORDER — LIDOCAINE 5 % EX PTCH: 1.0000 | MEDICATED_PATCH | Freq: Every day | CUTANEOUS | 0 refills | Status: DC | PRN

## 2020-07-01 MED ORDER — NAPROXEN 500 MG PO TABS: 500.0000 mg | ORAL_TABLET | Freq: Two times a day (BID) | ORAL | 0 refills | Status: DC | PRN

## 2020-07-01 MED ORDER — METOCLOPRAMIDE HCL 5 MG/ML IJ SOLN
10.0000 mg | Freq: Once | INTRAMUSCULAR | Status: AC
  Administered 2020-07-01: 10 mg via INTRAVENOUS
  Filled 2020-07-01: qty 2

## 2020-07-01 MED ORDER — DIPHENHYDRAMINE HCL 50 MG/ML IJ SOLN
12.5000 mg | Freq: Once | INTRAMUSCULAR | Status: AC
  Administered 2020-07-01: 12.5 mg via INTRAVENOUS
  Filled 2020-07-01: qty 1

## 2020-07-01 MED ORDER — ACETAMINOPHEN 325 MG PO TABS
650.0000 mg | ORAL_TABLET | Freq: Once | ORAL | Status: DC
  Filled 2020-07-01: qty 2

## 2020-07-01 MED ORDER — LORAZEPAM 2 MG/ML IJ SOLN
1.0000 mg | Freq: Once | INTRAMUSCULAR | Status: AC
  Administered 2020-07-01: 1 mg via INTRAVENOUS
  Filled 2020-07-01: qty 1

## 2020-07-01 NOTE — ED Provider Notes (Addendum)
[facility] EMERGENCY DEPARTMENT Provider Note   CSN: [id number] Arrival date & time: 07/01/20  0831     History Chief Complaint  Patient presents with  . Chest Pain    Connie Black is a 54 y.o. female with a history of tobacco abuse, hypertension, bipolar 1 disorder, anxiety, asthma, & polysubstance abuse who presents to the ED with complaints of chest pain that began last night. Patient states pain is located in the central chest, non-radiating, sharp in nature, waxing/waning in severity. No significant alleviating/aggravating factors. No intervention PTA. Having some mild associated dyspnea. Onset of pain occurred last night while she was at her close friend who passed away's vigil. She does not recall a specific injury/trauma, her husband does state that she did recently lift a heavy couch. Denies diaphoresis, nausea, vomiting, dizziness, lightheadedness, syncope,  leg pain/swelling, hemoptysis, recent surgery/trauma, recent long travel, hormone use, personal hx of cancer, or hx of DVT/PE. Denies personal or family hx of CAD. Denies drug use. She states she has not had her prescribed ativan this AM.   HPI     Past Medical History:  Diagnosis Date  . Anxiety   . Asthma   . Bipolar 1 disorder (HCC)   . Eating disorder   . Hypertension   . Lumbar radiculopathy, chronic   . Migraines     Patient Active Problem List   Diagnosis Date Noted  . Substance induced mood disorder (HCC) 10/06/2019  . Lumbar radiculopathy 09/24/2016  . S/P hysterectomy 12/24/2013  . Cellulitis and abscess of buttock 12/24/2013  . Ovarian torsion 08/21/2013  . Back pain 06/14/2011  . Anorexia 06/14/2011  . ASTHMA, INTERMITTENT 04/21/2010  . MIGRAINE HEADACHE 09/06/2009  . HERNIATED LUMBOSACRAL DISC 09/06/2009  . Polysubstance abuse (HCC) 05/26/2008  . BIPOLAR DISORDER 10/16/2006  . TOBACCO DEPENDENCE 10/16/2006  . HYPERTENSION, BENIGN SYSTEMIC 10/16/2006    Past Surgical  History:  Procedure Laterality Date  . "Release fluid from Brain" for Migraines June 2013    . ABDOMINAL HYSTERECTOMY    . LAPAROSCOPY Right 08/20/2013   Procedure: LAPAROSCOPY OPERATIVE;  Surgeon: Catalina Antigua, MD;  Location: [facility];  Service: Gynecology;  Laterality: Right;     OB History    Gravida  3   Para  3   Term  1   Preterm  2   AB      Living        SAB      TAB      Ectopic      Multiple      Live Births              Family History  Problem Relation Age of Onset  . Cancer Other     Social History   Tobacco Use  . Smoking status: Former Smoker    Packs/day: 0.50    Quit date: 08/09/2013    Years since quitting: 6.8  . Smokeless tobacco: Never Used  . Tobacco comment: is trying to quitt  Substance Use Topics  . Alcohol use: Yes    Comment: BAC .37  . Drug use: Yes    Types: Cocaine    Comment: Episodic use of cocaine    Home Medications Prior to Admission medications   Medication Sig Start Date End Date Taking? Authorizing Provider  lisinopril-hydrochlorothiazide (ZESTORETIC) 20-25 MG tablet Take 1 tablet by mouth daily.    [provider]  naratriptan (AMERGE) 2.5 MG tablet Take 2.5 mg by mouth as  needed for migraine. Take one (1) tablet at onset of headache; if returns or does not resolve, may repeat after 4 hours; do not exceed five (5) mg in 24 hours.    [provider]  QUEtiapine (SEROQUEL) 200 MG tablet Take 1 tablet (200 mg total) by mouth 2 (two) times daily. 10/06/19   Rankin, Shuvon B, NP    Allergies    Morphine  Review of Systems   Review of Systems  Constitutional: Negative for chills, diaphoresis and fever.  Respiratory: Positive for shortness of breath.   Cardiovascular: Positive for chest pain. Negative for leg swelling.  Gastrointestinal: Negative for abdominal pain, nausea and vomiting.  Neurological: Negative for dizziness, seizures, syncope, speech difficulty, weakness, light-headedness and  numbness.  All other systems reviewed and are negative.   Physical Exam Updated Vital Signs BP (!) 160/103 (BP Location: Right Arm)   Pulse 94   Temp 98.1 F (36.7 C)   Resp (!) 25   LMP 11/03/2011   SpO2 98%   Physical Exam Vitals and nursing note reviewed.  Constitutional:      General: She is not in acute distress.    Appearance: She is well-developed. She is not toxic-appearing.  HENT:     Head: Normocephalic and atraumatic.  Eyes:     General:        Right eye: No discharge.        Left eye: No discharge.     Conjunctiva/sclera: Conjunctivae normal.  Cardiovascular:     Rate and Rhythm: Normal rate and regular rhythm.     Pulses:          Radial pulses are 2+ on the right side and 2+ on the left side.       Dorsalis pedis pulses are 2+ on the right side and 2+ on the left side.       Posterior tibial pulses are 2+ on the right side and 2+ on the left side.     Heart sounds: No friction rub. No gallop.   Pulmonary:     Effort: Pulmonary effort is normal. No respiratory distress.     Breath sounds: Normal breath sounds. No wheezing, rhonchi or rales.  Chest:     Chest wall: Tenderness (anterior chest wall, no ovelrying skin changes, chaperone present) present. No crepitus.  Abdominal:     General: There is no distension.     Palpations: Abdomen is soft.     Tenderness: There is no abdominal tenderness.  Musculoskeletal:     Cervical back: Neck supple.     Right lower leg: No tenderness. No edema.     Left lower leg: No tenderness. No edema.  Skin:    General: Skin is warm and dry.     Findings: No rash.  Neurological:     General: No focal deficit present.     Mental Status: She is alert.     Comments: Clear speech.   Psychiatric:        Mood and Affect: Mood is anxious.     Comments: Tearful.      ED Results / Procedures / Treatments   Labs (all labs ordered are listed, but only abnormal results are displayed) Labs Reviewed  CBC WITH  DIFFERENTIAL/PLATELET - Abnormal; Notable for the following components:      Result Value   RBC 3.81 (*)    MCV 103.4 (*)    MCH 34.1 (*)    All other components within normal limits  BASIC METABOLIC PANEL  TROPONIN I (HIGH SENSITIVITY)  TROPONIN I (HIGH SENSITIVITY)    EKG EKG Interpretation  Date/Time:  Saturday July 01 2020 08:50:54 EST Ventricular Rate:  85 PR Interval:    QRS Duration: 73 QT Interval:  387 QTC Calculation: 461 R Axis:   81 Text Interpretation: Sinus rhythm Probable left atrial enlargement Left ventricular hypertrophy No significant change since last tracing Confirmed by Linwood Dibbles [phone]) on 07/01/2020 8:54:01 AM   Radiology DG Chest 2 View  Result Date: 07/01/2020 CLINICAL DATA:  Acute pain in the anterior ribs. EXAM: CHEST - 2 VIEW COMPARISON:  02/01/2011 FINDINGS: Asymmetric soft tissue noted in left lung apex, potentially related to overlying soft tissues. Lungs otherwise clear bilaterally. The cardiopericardial silhouette is within normal limits for size. The visualized bony structures of the thorax show no acute abnormality. IMPRESSION: Asymmetric soft tissue at the left lung apex. This is probably related to superimposition of soft tissues. Chest CT without contrast recommended to exclude apical/pleural lesion. Electronically Signed   By: Kennith Center M.D.   On: 07/01/2020 09:38   CT Chest Wo Contrast  Result Date: 07/01/2020 CLINICAL DATA:  Asymmetric soft tissue in the left lung apex on recent chest x-ray. EXAM: CT CHEST WITHOUT CONTRAST TECHNIQUE: Multidetector CT imaging of the chest was performed following the standard protocol without IV contrast. COMPARISON:  None. FINDINGS: Cardiovascular: The heart size is normal. No substantial pericardial effusion. Coronary artery calcification is evident. No thoracic aortic aneurysm. Mediastinum/Nodes: No mediastinal lymphadenopathy. No evidence for gross hilar lymphadenopathy although assessment is  limited by the lack of intravenous contrast on today's study. The esophagus has normal imaging features. There is no axillary lymphadenopathy. Lungs/Pleura: Centrilobular emphsyema noted. Calcified granuloma noted right middle lobe. Calcified granuloma also identified right lower lobe. No suspicious pulmonary nodule or mass. No focal airspace consolidation. No pleural effusion. Upper Abdomen: 9 mm low-density lesion upper pole right kidney is stable since abdomen CT 03/12/2018, compatible with benign cyst. Musculoskeletal: No worrisome lytic or sclerotic osseous abnormality. IMPRESSION: 1. No acute findings in the chest. No abnormal left apical lesion. Findings on x-ray likely related to superimposition of superficial soft tissue or arch vessel anatomy. 2. Emphysema (ICD10-J43.9). Electronically Signed   By: Kennith Center M.D.   On: 07/01/2020 11:12    Procedures Procedures (including critical care time)  Medications Ordered in ED Medications  LORazepam (ATIVAN) injection 1 mg (has no administration in time range)  acetaminophen (TYLENOL) tablet 650 mg (has no administration in time range)    ED Course  I have reviewed the triage vital signs and the nursing notes.  Pertinent labs & imaging results that were available during my care of the patient were reviewed by me and considered in my medical decision making (see chart for details).    MDM Rules/Calculators/A&P                          Patient presents to the emergency department with chest pain. BP noted to be elevated, vitals otherwise fairly unremarkable- tachypnea resolved on my assessment. Patient tearful, seems quite anxious, heart RRR, lungs CTA, chest pain reproducible with chest wall palpation. No peripheral edema.   DDX: ACS, pulmonary embolism, dissection, pneumothorax, pneumonia, arrhythmia, severe anemia, MSK, anxiety, GERD. Evaluation initiated with labs, EKG, and CXR. Patient on cardiac monitor.   I have ordered ativan and  tylenol for symptomatic relief.   Additional history obtained:  Additional history obtained from patient's  husband who relays patient did do some heavy lifting of a couch recently. Previous records obtained and reviewed- seen in February of this year for hallucinations- UDS positive for cocaine at that time- patient denies any drug use in past 48 hours- specifically denied cocaine/methamphetatmine use.   EKG: No significant changed compared to prior.   Lab Tests:  I Ordered, reviewed, and interpreted labs, which included:   Imaging Studies ordered:  I ordered imaging studies which included CXR & subsequently CT chest wo contrast, I independently visualized and interpreted imaging which showed 1. No acute findings in the chest. No abnormal left apical lesion. Findings on x-ray likely related to superimposition of superficial soft tissue or arch vessel anatomy. 2. Emphysema  ED Course:   Portions of this note were generated with Dragon dictation software. Dictation errors may occur despite best attempts at proofreading.  Heart pathway score 3- low risk - EKG without obvious ischemic changes compared to prior, delta troponin negative, doubt ACS. Patient is low risk wells, doubt pulmonary embolism. Pain is not a tearing sensation, symmetric pulses, no widening of mediastinum on CXR, doubt dissection. Cardiac monitor reviewed, no notable arrhythmias or tachycardia. Unclear definitive etiology, suspect anxiety/stress and MSK likely contributing factors. Following return of chest pain work-up patient started to complain of a migraine (gradual onset, similar to prior) ,no focal deficits, given migraine cocktail with improvement.   Patient has appeared hemodynamically stable throughout ER visit and appears appropriate for discharge. Naproxen & lidoderm patches prescribed for symptomatic care, relays she has ativan at home for anxiety. I discussed results, treatment plan, need for PCP follow-up, and return  precautions with the patient. Provided opportunity for questions, patient confirmed understanding and is in agreement with plan.   Findings and plan of care discussed with supervising physician Dr. Lynelle Doctor who is in agreement.    Final Clinical Impression(s) / ED Diagnoses Final diagnoses:  Chest pain, unspecified type    Rx / DC Orders ED Discharge Orders         Ordered    naproxen (NAPROSYN) 500 MG tablet  2 times daily PRN        07/01/20 1326    lidocaine (LIDODERM) 5 %  Daily PRN        07/01/20 [address] [place] R, PA-C 07/01/20 1358    Cherly Anderson, PA-C 07/01/20 1359    Linwood Dibbles, MD 07/02/20 [phone]

## 2020-07-01 NOTE — ED Notes (Signed)
Patient c/o chest pain onset last pm, states she hears a click in her chest , denies injury. States she takes Ativan everyday for personality disorder and bipolar.

## 2020-07-01 NOTE — ED Triage Notes (Signed)
Pt. Stated, I started having chest pain last night and its gotten worse. No other symptoms

## 2020-07-01 NOTE — Discharge Instructions (Addendum)
You were seen in the emergency department today for chest pain. Your work-up in the emergency department has been overall reassuring. Your labs have been fairly normal and or similar to previous blood work you have had done. Your EKG and the enzyme we use to check your heart did not show an acute heart attack at this time. Your chest CT did not show any new/acute issues with your lungs or ribs.   We are sending you home with the following medications to help with your symptoms:  - Naproxen is a nonsteroidal anti-inflammatory medication that will help with pain and swelling. Be sure to take this medication as prescribed with food, 1 pill every 12 hours,  It should be taken with food, as it can cause stomach upset, and more seriously, stomach bleeding. Do not take other nonsteroidal anti-inflammatory medications with this such as Advil, Motrin, Aleve, Mobic, Goodie Powder, or Motrin.    - Lidoderm patch- Please apply 1 patch to your are of most significant pain once per day. Remove within 12 hours of applications.   You make take Tylenol per over the counter dosing with these medications.   We have prescribed you new medication(s) today. Discuss the medications prescribed today with your pharmacist as they can have adverse effects and interactions with your other medicines including over the counter and prescribed medications. Seek medical evaluation if you start to experience new or abnormal symptoms after taking one of these medicines, seek care immediately if you start to experience difficulty breathing, feeling of your throat closing, facial swelling, or rash as these could be indications of a more serious allergic reaction      We would like you to follow up closely with your primary care provider and/or the cardiologist provided in your discharge instructions within 1-3 days. Return to the ER immediately should you experience any new or worsening symptoms including but not limited to return of  pain, worsened pain, vomiting, shortness of breath, dizziness, lightheadedness, passing out, or any other concerns that you may have.

## 2020-11-01 ENCOUNTER — Other Ambulatory Visit: Payer: Self-pay

## 2020-11-01 ENCOUNTER — Emergency Department (HOSPITAL_BASED_OUTPATIENT_CLINIC_OR_DEPARTMENT_OTHER)
Admission: EM | Admit: 2020-11-01 | Discharge: 2020-11-01 | Disposition: A | Discharge status: Home / Self Care | Payer: Medicaid - Out of State | Attending: Emergency Medicine | Admitting: Emergency Medicine

## 2020-11-01 ENCOUNTER — Encounter (HOSPITAL_BASED_OUTPATIENT_CLINIC_OR_DEPARTMENT_OTHER): Payer: Self-pay

## 2020-11-01 DIAGNOSIS — J45909 Unspecified asthma, uncomplicated: Secondary | ICD-10-CM | POA: Insufficient documentation

## 2020-11-01 DIAGNOSIS — L509 Urticaria, unspecified: Secondary | ICD-10-CM | POA: Diagnosis not present

## 2020-11-01 DIAGNOSIS — I10 Essential (primary) hypertension: Secondary | ICD-10-CM | POA: Insufficient documentation

## 2020-11-01 DIAGNOSIS — Z87891 Personal history of nicotine dependence: Secondary | ICD-10-CM | POA: Insufficient documentation

## 2020-11-01 DIAGNOSIS — R21 Rash and other nonspecific skin eruption: Secondary | ICD-10-CM | POA: Diagnosis present

## 2020-11-01 DIAGNOSIS — Z79899 Other long term (current) drug therapy: Secondary | ICD-10-CM | POA: Diagnosis not present

## 2020-11-01 MED ORDER — PREDNISONE 10 MG PO TABS: ORAL_TABLET | ORAL | 0 refills | Status: AC

## 2020-11-01 MED ORDER — EPINEPHRINE 0.3 MG/0.3ML IJ SOAJ
0.3000 mg | INTRAMUSCULAR | 1 refills | Status: AC | PRN
Start: 2020-11-01 — End: ?

## 2020-11-01 MED ORDER — PREDNISONE 50 MG PO TABS
60.0000 mg | ORAL_TABLET | Freq: Once | ORAL | Status: AC
  Administered 2020-11-01: 60 mg via ORAL
  Filled 2020-11-01: qty 1

## 2020-11-01 MED ORDER — HYDROXYZINE HCL 25 MG PO TABS
25.0000 mg | ORAL_TABLET | Freq: Once | ORAL | Status: AC
  Administered 2020-11-01: 25 mg via ORAL
  Filled 2020-11-01: qty 1

## 2020-11-01 MED ORDER — FAMOTIDINE 20 MG PO TABS: 20.0000 mg | ORAL_TABLET | Freq: Two times a day (BID) | ORAL | 0 refills | Status: DC

## 2020-11-01 MED ORDER — HYDROXYZINE HCL 25 MG PO TABS: 25.0000 mg | ORAL_TABLET | Freq: Four times a day (QID) | ORAL | 0 refills | Status: AC | PRN

## 2020-11-01 NOTE — Discharge Instructions (Addendum)
Allergic Reaction Instructions:  Benadryl: Take 25 mg of Benadryl every 6 hours for the next 24 hours.  Use caution as Benadryl can make you drowsy. Pepcid: Take the Pepcid, as prescribed, over the next 3 days. Prednisone: Take prednisone, as prescribed, until finished. Hydroxyzine: May use the hydroxyzine, as needed, for itching.  Use caution as hydroxyzine can make you drowsy. EpiPen: You have been prescribed an EpiPen to be used in the case of anaphylaxis. Please see the attached sheets regarding the symptoms that would cause you to have to use the EpiPen. If you have to use the EpiPen, you should always come to the emergency room immediately.  Follow-up with your primary care provider on this matter.  Allergy testing with an allergist may be warranted.  Return to the ED for worsening symptoms, shortness of breath, chest pain, palpitations, persistent vomiting, facial or throat swelling, or any other major concerns.  For prescription assistance, may try using prescription discount sites or apps, such as goodrx.com or Good Rx smart phone app.

## 2020-11-01 NOTE — ED Provider Notes (Signed)
[facility] EMERGENCY DEPARTMENT Provider Note   CSN: [id number] Arrival date & time: 11/01/20  1357     History Chief Complaint  Patient presents with  . Urticaria    Aleja Yearwood is a 55 y.o. female.  HPI     Kaaren Nass is a 55 y.o. female, with a history of anxiety, asthma, bipolar, HTN, presenting to the ED with pruritic rash for the last 2 weeks beginning after starting Wellbutrin. She states her psychiatrist originally started her on 175 mg, but because of the onset of rash reduced her dosage to 75 mg 2 days ago.  Patient's rash and itching has persisted.  She has tried calling her psychiatrist office, but has not yet received a response.  Benadryl has given her temporary relief, but symptoms recur. Patient states she is here visiting in the area from [place].  All of her providers are in [place]. Denies fever/chills, extremity swelling, facial swelling, throat swelling, shortness of breath, chest pain, persistent vomiting/diarrhea, skin sloughing, or any other complaints.   Past Medical History:  Diagnosis Date  . Anxiety   . Asthma   . Bipolar 1 disorder (HCC)   . Eating disorder   . Hypertension   . Lumbar radiculopathy, chronic   . Migraines     Patient Active Problem List   Diagnosis Date Noted  . Substance induced mood disorder (HCC) 10/06/2019  . Lumbar radiculopathy 09/24/2016  . S/P hysterectomy 12/24/2013  . Cellulitis and abscess of buttock 12/24/2013  . Ovarian torsion 08/21/2013  . Back pain 06/14/2011  . Anorexia 06/14/2011  . ASTHMA, INTERMITTENT 04/21/2010  . MIGRAINE HEADACHE 09/06/2009  . HERNIATED LUMBOSACRAL DISC 09/06/2009  . Polysubstance abuse (HCC) 05/26/2008  . BIPOLAR DISORDER 10/16/2006  . TOBACCO DEPENDENCE 10/16/2006  . HYPERTENSION, BENIGN SYSTEMIC 10/16/2006    Past Surgical History:  Procedure Laterality Date  . "Release fluid from Brain" for Migraines June 2013    . ABDOMINAL HYSTERECTOMY    .  LAPAROSCOPY Right 08/20/2013   Procedure: LAPAROSCOPY OPERATIVE;  Surgeon: Catalina Antigua, MD;  Location: [facility];  Service: Gynecology;  Laterality: Right;     OB History    Gravida  3   Para  3   Term  1   Preterm  2   AB      Living        SAB      IAB      Ectopic      Multiple      Live Births              Family History  Problem Relation Age of Onset  . Cancer Other     Social History   Tobacco Use  . Smoking status: Former Smoker    Packs/day: 0.50    Quit date: 08/09/2013    Years since quitting: 7.2  . Smokeless tobacco: Never Used  . Tobacco comment: is trying to quitt  Substance Use Topics  . Alcohol use: Not Currently  . Drug use: Not Currently    Types: Cocaine    Home Medications Prior to Admission medications   Medication Sig Start Date End Date Taking? Authorizing Provider  EPINEPHrine (EPIPEN 2-PAK) 0.3 mg/0.3 mL IJ SOAJ injection Inject 0.3 mg into the muscle as needed for anaphylaxis. 11/01/20  Yes Jerre Diguglielmo C, PA-C  famotidine (PEPCID) 20 MG tablet Take 1 tablet (20 mg total) by mouth 2 (two) times daily for 3 days. 11/01/20 11/04/20 Yes Ayesha Markwell,  Rayelynn Loyal C, PA-C  hydrOXYzine (ATARAX/VISTARIL) 25 MG tablet Take 1 tablet (25 mg total) by mouth every 6 (six) hours as needed for itching. 11/01/20  Yes Natanya Holecek C, PA-C  predniSONE (DELTASONE) 10 MG tablet Take 4 tablets (40 mg total) by mouth daily for 5 days, THEN 3 tablets (30 mg total) daily for 1 day, THEN 2 tablets (20 mg total) daily for 1 day, THEN 1 tablet (10 mg total) daily for 1 day. 11/01/20 11/09/20 Yes Goldye Tourangeau C, PA-C  lidocaine (LIDODERM) 5 % Place 1 patch onto the skin daily as needed. Apply patch to area most significant pain once per day.  Remove and discard patch within 12 hours of application. 07/01/20   Petrucelli, Samantha R, PA-C  lisinopril-hydrochlorothiazide (ZESTORETIC) 20-25 MG tablet Take 1 tablet by mouth daily.    [provider]  naproxen (NAPROSYN) 500 MG  tablet Take 1 tablet (500 mg total) by mouth 2 (two) times daily as needed for moderate pain. 07/01/20   Petrucelli, Samantha R, PA-C  naratriptan (AMERGE) 2.5 MG tablet Take 2.5 mg by mouth as needed for migraine. Take one (1) tablet at onset of headache; if returns or does not resolve, may repeat after 4 hours; do not exceed five (5) mg in 24 hours.    [provider]  QUEtiapine (SEROQUEL) 200 MG tablet Take 1 tablet (200 mg total) by mouth 2 (two) times daily. 10/06/19   Rankin, Shuvon B, NP    Allergies    Morphine  Review of Systems   Review of Systems  Constitutional: Negative for chills and fever.  HENT: Negative for sore throat, trouble swallowing and voice change.   Respiratory: Negative for cough and shortness of breath.   Gastrointestinal: Negative for abdominal pain, diarrhea, nausea and vomiting.  Musculoskeletal: Negative for arthralgias, back pain, myalgias, neck pain and neck stiffness.  Skin: Positive for rash.  Neurological: Negative for weakness and numbness.  All other systems reviewed and are negative.   Physical Exam Updated Vital Signs BP (!) 175/103 (BP Location: Left Arm)   Pulse (!) 102   Temp 98.6 F (37 C) (Oral)   Resp 18   LMP 11/03/2011   SpO2 100%   Physical Exam Vitals and nursing note reviewed.  Constitutional:      General: She is not in acute distress.    Appearance: She is well-developed. She is not diaphoretic.  HENT:     Head: Normocephalic and atraumatic.     Mouth/Throat:     Mouth: Mucous membranes are moist.     Pharynx: Oropharynx is clear.     Comments: No perioral or intraoral swelling or lesions.  No facial swelling.  No trismus or phonation abnormality. No swelling or tenderness to the soft tissues of the neck. Eyes:     Conjunctiva/sclera: Conjunctivae normal.  Cardiovascular:     Rate and Rhythm: Normal rate and regular rhythm.     Pulses: Normal pulses.          Radial pulses are 2+ on the right side and 2+ on  the left side.  Pulmonary:     Effort: Pulmonary effort is normal. No respiratory distress.     Breath sounds: Normal breath sounds.  Abdominal:     Palpations: Abdomen is soft.     Tenderness: There is no abdominal tenderness. There is no guarding.  Musculoskeletal:     Cervical back: Normal range of motion and neck supple. No tenderness.     Right  lower leg: No edema.     Left lower leg: No edema.  Lymphadenopathy:     Cervical: No cervical adenopathy.  Skin:    General: Skin is warm and dry.     Capillary Refill: Capillary refill takes less than 2 seconds.     Findings: Rash present.     Comments: Diffuse, erythematous rash with intermittent excoriations noted across the patient's entire body.  No vesicles or pustules.  Neurological:     Mental Status: She is alert and oriented to person, place, and time.  Psychiatric:        Mood and Affect: Mood and affect normal.        Speech: Speech normal.        Behavior: Behavior normal.     ED Results / Procedures / Treatments   Labs (all labs ordered are listed, but only abnormal results are displayed) Labs Reviewed - No data to display  EKG None  Radiology No results found.  Procedures Procedures   Medications Ordered in ED Medications  hydrOXYzine (ATARAX/VISTARIL) tablet 25 mg (25 mg Oral Given 11/01/20 1659)  predniSONE (DELTASONE) tablet 60 mg (60 mg Oral Given 11/01/20 1659)    ED Course  I have reviewed the triage vital signs and the nursing notes.  Pertinent labs & imaging results that were available during my care of the patient were reviewed by me and considered in my medical decision making (see chart for details).    MDM Rules/Calculators/A&P                          Patient presents with pruritic rash suggestive of urticaria.  She does not have features to suggest a life-threatening rash or anaphylaxis. Alternative sources of rash, such as infection, Lyme disease, RMSF, etc. were considered but thought  less likely as patient's exam, timeline, and history elements are not suggestive. The patient was given instructions for home care as well as return precautions. Patient voices understanding of these instructions, accepts the plan, and is comfortable with discharge.   Final Clinical Impression(s) / ED Diagnoses Final diagnoses:  Urticaria    Rx / DC Orders ED Discharge Orders         Ordered    hydrOXYzine (ATARAX/VISTARIL) 25 MG tablet  Every 6 hours PRN        11/01/20 1653    famotidine (PEPCID) 20 MG tablet  2 times daily        11/01/20 1653    predniSONE (DELTASONE) 10 MG tablet        11/01/20 1653    EPINEPHrine (EPIPEN 2-PAK) 0.3 mg/0.3 mL IJ SOAJ injection  As needed        11/01/20 1655           Anselm Pancoast, PA-C 11/01/20 1704    Benjiman Core, MD 11/01/20 2303

## 2020-11-01 NOTE — ED Triage Notes (Signed)
Pt c/o intermittent hives x 2 weeks after starting bupropion-states her doctor decreased dose 2 days ago-pt last dose benadryl 100mg  ~11am-NAD-to triage in w/c-NAD

## 2020-11-01 NOTE — ED Notes (Signed)
Pt states she will follow up with her PCP about there BP reports she has been taken off of her BP Medication by her PCP, PA made aware

## 2022-01-10 ENCOUNTER — Other Ambulatory Visit: Payer: Self-pay | Admitting: Physician Assistant

## 2022-01-10 DIAGNOSIS — Z1231 Encounter for screening mammogram for malignant neoplasm of breast: Secondary | ICD-10-CM

## 2022-01-29 ENCOUNTER — Ambulatory Visit: Payer: Medicaid - Out of State

## 2022-02-07 ENCOUNTER — Encounter (HOSPITAL_BASED_OUTPATIENT_CLINIC_OR_DEPARTMENT_OTHER): Payer: Self-pay | Admitting: *Deleted

## 2022-02-07 ENCOUNTER — Emergency Department (HOSPITAL_BASED_OUTPATIENT_CLINIC_OR_DEPARTMENT_OTHER)
Admission: EM | Admit: 2022-02-07 | Discharge: 2022-02-07 | Disposition: A | Discharge status: Home / Self Care | Payer: Medicaid Other | Attending: Emergency Medicine | Admitting: Emergency Medicine

## 2022-02-07 DIAGNOSIS — I10 Essential (primary) hypertension: Secondary | ICD-10-CM | POA: Diagnosis not present

## 2022-02-07 DIAGNOSIS — R000 Tachycardia, unspecified: Secondary | ICD-10-CM | POA: Diagnosis not present

## 2022-02-07 DIAGNOSIS — Z79899 Other long term (current) drug therapy: Secondary | ICD-10-CM | POA: Insufficient documentation

## 2022-02-07 DIAGNOSIS — R519 Headache, unspecified: Secondary | ICD-10-CM | POA: Diagnosis present

## 2022-02-07 DIAGNOSIS — G43111 Migraine with aura, intractable, with status migrainosus: Secondary | ICD-10-CM | POA: Insufficient documentation

## 2022-02-07 MED ORDER — KETOROLAC TROMETHAMINE 15 MG/ML IJ SOLN
15.0000 mg | Freq: Once | INTRAMUSCULAR | Status: AC
  Administered 2022-02-07: 15 mg via INTRAVENOUS
  Filled 2022-02-07: qty 1

## 2022-02-07 MED ORDER — DEXAMETHASONE SODIUM PHOSPHATE 10 MG/ML IJ SOLN
10.0000 mg | Freq: Once | INTRAMUSCULAR | Status: AC
  Administered 2022-02-07: 10 mg via INTRAVENOUS
  Filled 2022-02-07: qty 1

## 2022-02-07 MED ORDER — LACTATED RINGERS IV BOLUS
1000.0000 mL | Freq: Once | INTRAVENOUS | Status: AC
  Administered 2022-02-07: 1000 mL via INTRAVENOUS

## 2022-02-07 MED ORDER — PROCHLORPERAZINE EDISYLATE 10 MG/2ML IJ SOLN
10.0000 mg | Freq: Once | INTRAMUSCULAR | Status: AC
  Administered 2022-02-07: 10 mg via INTRAVENOUS
  Filled 2022-02-07: qty 2

## 2022-02-07 MED ORDER — DIPHENHYDRAMINE HCL 50 MG/ML IJ SOLN
25.0000 mg | Freq: Once | INTRAMUSCULAR | Status: AC
  Administered 2022-02-07: 25 mg via INTRAVENOUS
  Filled 2022-02-07: qty 1

## 2022-02-07 NOTE — ED Notes (Signed)
ED Provider at bedside. 

## 2022-02-07 NOTE — ED Provider Notes (Signed)
[facility] EMERGENCY DEPARTMENT Provider Note   CSN: [id number] Arrival date & time: 02/07/22  0750     History  Chief Complaint  Patient presents with   Migraine    Connie Black is a 56 y.o. female.  Patient is a 56 year old female with a history of asthma, migraines, eating disorder, bipolar disease, hypertension who is presenting today with complaint of headache for the last 5 days.  She reports this feels like her migraine goes across the front of her head the back of her head and down into her neck.  She has had intermittent bouts of losing her vision in her right eye this morning but it has returned.  She has had nausea and vomiting.  She denies any fever.  She reports this feels very similar to her prior migraines.  She is coming from [place] where she was getting Botox injections regularly and has not had them and missed her appointment with neurology 2 days ago because she got the schedule mixed up and is not able to see them now until August.  She does not take daily oral therapy.  The history is provided by the patient and the spouse.  Migraine       Home Medications Prior to Admission medications   Medication Sig Start Date End Date Taking? Authorizing Provider  EPINEPHrine (EPIPEN 2-PAK) 0.3 mg/0.3 mL IJ SOAJ injection Inject 0.3 mg into the muscle as needed for anaphylaxis. 11/01/20   Joy, Shawn C, PA-C  famotidine (PEPCID) 20 MG tablet Take 1 tablet (20 mg total) by mouth 2 (two) times daily for 3 days. 11/01/20 11/04/20  Joy, Shawn C, PA-C  hydrOXYzine (ATARAX/VISTARIL) 25 MG tablet Take 1 tablet (25 mg total) by mouth every 6 (six) hours as needed for itching. 11/01/20   Joy, Shawn C, PA-C  lidocaine (LIDODERM) 5 % Place 1 patch onto the skin daily as needed. Apply patch to area most significant pain once per day.  Remove and discard patch within 12 hours of application. 07/01/20   Petrucelli, Samantha R, PA-C  lisinopril-hydrochlorothiazide (ZESTORETIC)  20-25 MG tablet Take 1 tablet by mouth daily.    [provider]  naproxen (NAPROSYN) 500 MG tablet Take 1 tablet (500 mg total) by mouth 2 (two) times daily as needed for moderate pain. 07/01/20   Petrucelli, Samantha R, PA-C  naratriptan (AMERGE) 2.5 MG tablet Take 2.5 mg by mouth as needed for migraine. Take one (1) tablet at onset of headache; if returns or does not resolve, may repeat after 4 hours; do not exceed five (5) mg in 24 hours.    [provider]  QUEtiapine (SEROQUEL) 200 MG tablet Take 1 tablet (200 mg total) by mouth 2 (two) times daily. 10/06/19   Rankin, Shuvon B, NP      Allergies    Morphine    Review of Systems   Review of Systems  Physical Exam Updated Vital Signs BP 135/86 (BP Location: Right Arm)   Pulse 100   Temp 98.1 F (36.7 C) (Oral)   Resp 16   LMP 11/03/2011   SpO2 100%  Physical Exam Vitals and nursing note reviewed.  Constitutional:      General: She is not in acute distress.    Appearance: She is well-developed.  HENT:     Head: Normocephalic and atraumatic.  Eyes:     Pupils: Pupils are equal, round, and reactive to light.     Funduscopic exam:    Right eye:  No papilledema.        Left eye: No papilledema.  Cardiovascular:     Rate and Rhythm: Regular rhythm. Tachycardia present.     Heart sounds: Normal heart sounds. No murmur heard.    No friction rub.  Pulmonary:     Effort: Pulmonary effort is normal.     Breath sounds: Normal breath sounds. No wheezing or rales.  Abdominal:     General: Bowel sounds are normal. There is no distension.     Palpations: Abdomen is soft.     Tenderness: There is no abdominal tenderness. There is no guarding or rebound.  Musculoskeletal:        General: No tenderness. Normal range of motion.     Cervical back: Normal range of motion and neck supple.     Comments: No edema  Lymphadenopathy:     Cervical: No cervical adenopathy.  Skin:    General: Skin is warm and dry.      Findings: No rash.  Neurological:     Mental Status: She is alert and oriented to person, place, and time.     Cranial Nerves: No cranial nerve deficit.     Sensory: No sensory deficit.     Motor: No weakness.     Gait: Gait normal.     Comments: photophobia  Psychiatric:        Mood and Affect: Mood normal.        Behavior: Behavior normal.     ED Results / Procedures / Treatments   Labs (all labs ordered are listed, but only abnormal results are displayed) Labs Reviewed - No data to display  EKG None  Radiology No results found.  Procedures Procedures    Medications Ordered in ED Medications  prochlorperazine (COMPAZINE) injection 10 mg (10 mg Intravenous Given 02/07/22 0825)  diphenhydrAMINE (BENADRYL) injection 25 mg (25 mg Intravenous Given 02/07/22 0827)  ketorolac (TORADOL) 15 MG/ML injection 15 mg (15 mg Intravenous Given 02/07/22 0826)  dexamethasone (DECADRON) injection 10 mg (10 mg Intravenous Given 02/07/22 0828)  lactated ringers bolus 1,000 mL (0 mLs Intravenous Stopped 02/07/22 0919)    ED Course/ Medical Decision Making/ A&P                           Medical Decision Making Risk Prescription drug management.   Pt with typical migraine HA without sx suggestive of SAH(sudden onset, worst of life, or deficits), infection, or cavernous vein thrombosis.  Normal neuro exam and vital signs.  Patient does have complex migraines requiring Botox injections and now has not had them because she recently moved to [place].  She missed her appointment with neurology 2 days ago due to getting confused about the schedule and they made a new appointment for August. Will give HA cocktail and on re-eval.  9:44 AM Pt feeling better will d/c home.  Ambulatory referral to neurology.         Final Clinical Impression(s) / ED Diagnoses Final diagnoses:  Intractable migraine with aura with status migrainosus    Rx / DC Orders ED Discharge Orders           Ordered    Ambulatory referral to Neurology       Comments: An appointment is requested in approximately: 2 weeks   02/07/22 [id number]              Gwyneth Sprout, MD 02/07/22 [phone]

## 2022-02-07 NOTE — ED Notes (Signed)
Patient stated is was hard to see due to pain in head Cool cloth for forehead  Warm blanket  Lights dimmed

## 2022-02-07 NOTE — ED Triage Notes (Signed)
States she is having a migraine, onset 4 days ago. . States has hx of migraines

## 2022-02-07 NOTE — ED Notes (Signed)
Client states she feels much better after IV medications and IVF, states pain is a 4 on 0-10 scale, able to tolerate the room lights better and no longer feels nausea and has strong appetite to eat. AVS given to client

## 2022-02-07 NOTE — ED Notes (Signed)
Onset 4 days ago with migraine, has hx of such, pain is across top of head and very sensitive to light. Also having nausea as well.

## 2022-02-10 ENCOUNTER — Encounter (HOSPITAL_BASED_OUTPATIENT_CLINIC_OR_DEPARTMENT_OTHER): Payer: Self-pay | Admitting: Emergency Medicine

## 2022-02-10 ENCOUNTER — Other Ambulatory Visit: Payer: Self-pay

## 2022-02-10 ENCOUNTER — Emergency Department (HOSPITAL_BASED_OUTPATIENT_CLINIC_OR_DEPARTMENT_OTHER)
Admission: EM | Admit: 2022-02-10 | Discharge: 2022-02-10 | Disposition: A | Discharge status: Home / Self Care | Payer: Medicaid Other | Attending: Emergency Medicine | Admitting: Emergency Medicine

## 2022-02-10 DIAGNOSIS — R519 Headache, unspecified: Secondary | ICD-10-CM | POA: Diagnosis present

## 2022-02-10 DIAGNOSIS — G43019 Migraine without aura, intractable, without status migrainosus: Secondary | ICD-10-CM

## 2022-02-10 MED ORDER — KETOROLAC TROMETHAMINE 15 MG/ML IJ SOLN
15.0000 mg | Freq: Once | INTRAMUSCULAR | Status: AC
  Administered 2022-02-10: 15 mg via INTRAVENOUS
  Filled 2022-02-10: qty 1

## 2022-02-10 MED ORDER — PROCHLORPERAZINE EDISYLATE 10 MG/2ML IJ SOLN
10.0000 mg | Freq: Once | INTRAMUSCULAR | Status: AC
  Administered 2022-02-10: 10 mg via INTRAVENOUS
  Filled 2022-02-10: qty 2

## 2022-02-10 MED ORDER — LACTATED RINGERS IV BOLUS
1000.0000 mL | Freq: Once | INTRAVENOUS | Status: AC
  Administered 2022-02-10: 1000 mL via INTRAVENOUS

## 2022-02-10 MED ORDER — DIPHENHYDRAMINE HCL 50 MG/ML IJ SOLN
12.5000 mg | Freq: Once | INTRAMUSCULAR | Status: AC
Start: 2022-02-10 — End: 2022-02-10
  Administered 2022-02-10: 12.5 mg via INTRAVENOUS
  Filled 2022-02-10: qty 1

## 2022-02-10 NOTE — ED Provider Notes (Signed)
[facility] EMERGENCY DEPARTMENT Provider Note   CSN: [id number] Arrival date & time: 02/10/22  1526     History  Chief Complaint  Patient presents with   Headache    Connie Black is a 56 y.o. female who presents emergency department complaint of a migraine.  Patient has a history of migraines, and recently moved to the area.  Where she previously lived she was seeing a neurologist and was getting Botox injections every 90 days to help with her migraines.  She has been appointment with the neurologist in August.  She was seen in this facility 3 days ago for similar symptoms, got improvement with IV medications, and was discharged home.  She states that starting again today she was getting some sensitivity to light, visual disturbance and seeing "spots" in her vision, as well as nausea and vomiting.  She tried taking 2 Excedrin Migraine prior to ER arrival, without relief.  She also noticed a metallic taste in her mouth, that sometimes happens before the migraines and sometimes during.  No other changes to this migraine compared to her others.   Headache Associated symptoms: nausea, photophobia and vomiting   Associated symptoms: no numbness and no weakness        Home Medications Prior to Admission medications   Medication Sig Start Date End Date Taking? Authorizing Provider  EPINEPHrine (EPIPEN 2-PAK) 0.3 mg/0.3 mL IJ SOAJ injection Inject 0.3 mg into the muscle as needed for anaphylaxis. 11/01/20   Joy, Shawn C, PA-C  famotidine (PEPCID) 20 MG tablet Take 1 tablet (20 mg total) by mouth 2 (two) times daily for 3 days. 11/01/20 11/04/20  Joy, Shawn C, PA-C  hydrOXYzine (ATARAX/VISTARIL) 25 MG tablet Take 1 tablet (25 mg total) by mouth every 6 (six) hours as needed for itching. 11/01/20   Joy, Shawn C, PA-C  lidocaine (LIDODERM) 5 % Place 1 patch onto the skin daily as needed. Apply patch to area most significant pain once per day.  Remove and discard patch within 12 hours of  application. 07/01/20   Petrucelli, Samantha R, PA-C  lisinopril-hydrochlorothiazide (ZESTORETIC) 20-25 MG tablet Take 1 tablet by mouth daily.    [provider]  naproxen (NAPROSYN) 500 MG tablet Take 1 tablet (500 mg total) by mouth 2 (two) times daily as needed for moderate pain. 07/01/20   Petrucelli, Samantha R, PA-C  naratriptan (AMERGE) 2.5 MG tablet Take 2.5 mg by mouth as needed for migraine. Take one (1) tablet at onset of headache; if returns or does not resolve, may repeat after 4 hours; do not exceed five (5) mg in 24 hours.    [provider]  QUEtiapine (SEROQUEL) 200 MG tablet Take 1 tablet (200 mg total) by mouth 2 (two) times daily. 10/06/19   Rankin, Shuvon B, NP      Allergies    Morphine    Review of Systems   Review of Systems  Eyes:  Positive for photophobia and visual disturbance.  Gastrointestinal:  Positive for nausea and vomiting.  Neurological:  Positive for headaches. Negative for speech difficulty, weakness and numbness.  All other systems reviewed and are negative.   Physical Exam Updated Vital Signs BP (!) 151/98   Pulse (!) 101   Temp 98.2 F (36.8 C) (Oral)   Resp 18   Ht 5\' 7"  (1.702 m)   Wt 46.3 kg   LMP 11/03/2011   SpO2 100%   BMI 15.98 kg/m  Physical Exam Vitals and nursing note reviewed.  Constitutional:      Appearance: Normal appearance.  HENT:     Head: Normocephalic and atraumatic.  Eyes:     Conjunctiva/sclera: Conjunctivae normal.  Pulmonary:     Effort: Pulmonary effort is normal. No respiratory distress.  Skin:    General: Skin is warm and dry.  Neurological:     Mental Status: She is alert.     Comments: Neuro: Speech is clear, able to follow commands. CN III-XII intact grossly intact. PERRLA. EOMI. Sensation intact throughout. Str 5/5 all extremities.  Psychiatric:        Mood and Affect: Mood normal.        Behavior: Behavior normal.     ED Results / Procedures / Treatments   Labs (all labs  ordered are listed, but only abnormal results are displayed) Labs Reviewed - No data to display  EKG None  Radiology No results found.  Procedures Procedures    Medications Ordered in ED Medications  prochlorperazine (COMPAZINE) injection 10 mg (10 mg Intravenous Given 02/10/22 1644)  diphenhydrAMINE (BENADRYL) injection 12.5 mg (12.5 mg Intravenous Given 02/10/22 1638)  ketorolac (TORADOL) 15 MG/ML injection 15 mg (15 mg Intravenous Given 02/10/22 1639)  lactated ringers bolus 1,000 mL (1,000 mLs Intravenous New Bag/Given 02/10/22 1643)    ED Course/ Medical Decision Making/ A&P                           Medical Decision Making Risk Prescription drug management.   This patient is a 56 y.o. female who presents to the ED for concern of migraine.   Differential diagnoses prior to evaluation: Migraine, common headache, subarachnoid hemorrhage, infection  Past Medical History / Social History / Additional history: Chart reviewed. Pertinent results include: Patient was seen in this ER on 6/22 for similar symptoms.  Was given migraine cocktail including fluids, Benadryl, Toradol, Compazine, and Decadron.  She states she got good relief without medication, was discharged in stable condition.  Physical Exam: Physical exam performed. The pertinent findings include: Photophobia. Normal vital signs. Normal neurologic exam as above. No red flag symptoms.   Medications / Treatment: Migraine cocktail - IV fluids, toradol, benadryl, and compazine   Disposition: After consideration of the diagnostic results and the patients response to treatment, I feel that emergency department workup does not suggest an emergent condition requiring admission or immediate intervention beyond what has been performed at this time. The plan is: discharge to home with recommendation to follow up at scheduled neurology appointment. The patient is safe for discharge and has been instructed to return immediately  for worsening symptoms, change in symptoms or any other concerns.   Final Clinical Impression(s) / ED Diagnoses Final diagnoses:  Intractable migraine without aura and without status migrainosus    Rx / DC Orders ED Discharge Orders     None      Portions of this report may have been transcribed using voice recognition software. Every effort was made to ensure accuracy; however, inadvertent computerized transcription errors may be present.    Jeanella Flattery 02/10/22 1718    Maia Plan, MD 02/13/22 1014

## 2022-02-10 NOTE — ED Triage Notes (Signed)
Pt arrives pov, steady gait, c/o HA with n/v, endorses hx of migraines. Seen on 6/22 for same. Took 2 Excedrin migraine pta.

## 2022-02-27 ENCOUNTER — Emergency Department (HOSPITAL_BASED_OUTPATIENT_CLINIC_OR_DEPARTMENT_OTHER)
Admission: EM | Admit: 2022-02-27 | Discharge: 2022-02-27 | Disposition: A | Discharge status: Home / Self Care | Payer: Medicaid Other | Attending: Emergency Medicine | Admitting: Emergency Medicine

## 2022-02-27 ENCOUNTER — Encounter (HOSPITAL_BASED_OUTPATIENT_CLINIC_OR_DEPARTMENT_OTHER): Payer: Self-pay | Admitting: Emergency Medicine

## 2022-02-27 DIAGNOSIS — R519 Headache, unspecified: Secondary | ICD-10-CM | POA: Insufficient documentation

## 2022-02-27 MED ORDER — KETOROLAC TROMETHAMINE 15 MG/ML IJ SOLN
15.0000 mg | Freq: Once | INTRAMUSCULAR | Status: AC
  Administered 2022-02-27: 15 mg via INTRAMUSCULAR
  Filled 2022-02-27: qty 1

## 2022-02-27 MED ORDER — KETOROLAC TROMETHAMINE 10 MG PO TABS: 10.0000 mg | ORAL_TABLET | Freq: Four times a day (QID) | ORAL | 0 refills | Status: AC | PRN

## 2022-02-27 MED ORDER — PROCHLORPERAZINE EDISYLATE 10 MG/2ML IJ SOLN
10.0000 mg | Freq: Once | INTRAMUSCULAR | Status: AC
  Administered 2022-02-27: 10 mg via INTRAMUSCULAR
  Filled 2022-02-27: qty 2

## 2022-02-27 MED ORDER — PROCHLORPERAZINE MALEATE 10 MG PO TABS: 10.0000 mg | ORAL_TABLET | Freq: Two times a day (BID) | ORAL | 0 refills | Status: DC | PRN

## 2022-02-27 NOTE — Discharge Instructions (Signed)
Call your primary care doctor or specialist as discussed in the next 2-3 days.   Return immediately back to the ER if:  Your symptoms worsen within the next 12-24 hours. You develop new symptoms such as new fevers, persistent vomiting, new pain, shortness of breath, or new weakness or numbness, or if you have any other concerns.  

## 2022-02-27 NOTE — ED Notes (Signed)
Rx x 2 given  Written and verbal inst to pt  Verbalized an understanding  To home with family 

## 2022-02-27 NOTE — ED Provider Notes (Signed)
[facility] EMERGENCY DEPARTMENT Provider Note   CSN: [id number] Arrival date & time: 02/27/22  1823     History  Chief Complaint  Patient presents with   Headache    Connie Black is a 56 y.o. female.  Patient presents ER chief complaint of recurrent headache.  She has a history of migraine headaches.  Has been here a few times in the past for headaches.  States that she had recurrence for the past 4 days unable to be aborted at home and presented to the ER.  Very similar to prior headaches she states with spots, denies any fevers or cough or vomiting or diarrhea denies any neck pain.  She still awaiting her neurology appointment August 2.       Home Medications Prior to Admission medications   Medication Sig Start Date End Date Taking? Authorizing Provider  ketorolac (TORADOL) 10 MG tablet Take 1 tablet (10 mg total) by mouth every 6 (six) hours as needed. 02/27/22  Yes Sadee Osland, Eustace Moore, MD  prochlorperazine (COMPAZINE) 10 MG tablet Take 1 tablet (10 mg total) by mouth 2 (two) times daily as needed for up to 6 doses for nausea or vomiting (or Headaches). 02/27/22  Yes Cheryll Cockayne, MD  EPINEPHrine (EPIPEN 2-PAK) 0.3 mg/0.3 mL IJ SOAJ injection Inject 0.3 mg into the muscle as needed for anaphylaxis. 11/01/20   Joy, Shawn C, PA-C  famotidine (PEPCID) 20 MG tablet Take 1 tablet (20 mg total) by mouth 2 (two) times daily for 3 days. 11/01/20 11/04/20  Joy, Shawn C, PA-C  hydrOXYzine (ATARAX/VISTARIL) 25 MG tablet Take 1 tablet (25 mg total) by mouth every 6 (six) hours as needed for itching. 11/01/20   Joy, Shawn C, PA-C  lidocaine (LIDODERM) 5 % Place 1 patch onto the skin daily as needed. Apply patch to area most significant pain once per day.  Remove and discard patch within 12 hours of application. 07/01/20   Petrucelli, Samantha R, PA-C  lisinopril-hydrochlorothiazide (ZESTORETIC) 20-25 MG tablet Take 1 tablet by mouth daily.    [provider]  naproxen (NAPROSYN)  500 MG tablet Take 1 tablet (500 mg total) by mouth 2 (two) times daily as needed for moderate pain. 07/01/20   Petrucelli, Samantha R, PA-C  naratriptan (AMERGE) 2.5 MG tablet Take 2.5 mg by mouth as needed for migraine. Take one (1) tablet at onset of headache; if returns or does not resolve, may repeat after 4 hours; do not exceed five (5) mg in 24 hours.    [provider]  QUEtiapine (SEROQUEL) 200 MG tablet Take 1 tablet (200 mg total) by mouth 2 (two) times daily. 10/06/19   Rankin, Shuvon B, NP      Allergies    Morphine    Review of Systems   Review of Systems  Constitutional:  Negative for fever.  HENT:  Negative for ear pain.   Eyes:  Negative for pain.  Respiratory:  Negative for cough.   Cardiovascular:  Negative for chest pain.  Gastrointestinal:  Negative for abdominal pain.  Genitourinary:  Negative for flank pain.  Musculoskeletal:  Negative for back pain.  Skin:  Negative for rash.  Neurological:  Positive for headaches.    Physical Exam Updated Vital Signs BP (!) 150/112 (BP Location: Right Arm)   Pulse 85   Temp 98.2 F (36.8 C) (Oral)   Resp 16   Ht 5\' 7"  (1.702 m)   Wt 47 kg   LMP 11/03/2011   SpO2  100%   BMI 16.23 kg/m  Physical Exam Constitutional:      General: She is not in acute distress.    Appearance: Normal appearance.  HENT:     Head: Normocephalic.     Nose: Nose normal.  Eyes:     Extraocular Movements: Extraocular movements intact.  Cardiovascular:     Rate and Rhythm: Normal rate.  Pulmonary:     Effort: Pulmonary effort is normal.  Musculoskeletal:        General: Normal range of motion.     Cervical back: Normal range of motion.  Neurological:     General: No focal deficit present.     Mental Status: She is alert and oriented to person, place, and time. Mental status is at baseline.     Cranial Nerves: No cranial nerve deficit.     Motor: No weakness.     Gait: Gait normal.     ED Results / Procedures /  Treatments   Labs (all labs ordered are listed, but only abnormal results are displayed) Labs Reviewed - No data to display  EKG None  Radiology No results found.  Procedures Procedures    Medications Ordered in ED Medications  prochlorperazine (COMPAZINE) injection 10 mg (10 mg Intramuscular Given 02/27/22 2302)  ketorolac (TORADOL) 15 MG/ML injection 15 mg (15 mg Intramuscular Given 02/27/22 2302)    ED Course/ Medical Decision Making/ A&P                           Medical Decision Making Risk Prescription drug management.   History obtained from family bedside.  Review of record shows prior visit for headaches February 10, 2022.  Patient given IM Toradol and Compazine with improvement of symptoms.  We will give prescription of medications to go home with.  Advised outpatient follow-up with her neurologist, patient instructed to call this week.  Advised immediate return for worsening symptoms fevers or any additional concerns.        Final Clinical Impression(s) / ED Diagnoses Final diagnoses:  Acute nonintractable headache, unspecified headache type    Rx / DC Orders ED Discharge Orders          Ordered    ketorolac (TORADOL) 10 MG tablet  Every 6 hours PRN        02/27/22 2339    prochlorperazine (COMPAZINE) 10 MG tablet  2 times daily PRN        02/27/22 2339              Cheryll Cockayne, MD 02/27/22 2339

## 2022-02-27 NOTE — ED Triage Notes (Signed)
Pt arrives pov ambulatory c/o headache x 4 days. Reports changes in vision ("seeing spots"). Endorses hx of migraines. Seen multiple times for same. Has new neurology appt 8/2.

## 2023-03-14 ENCOUNTER — Encounter (HOSPITAL_COMMUNITY): Payer: Self-pay

## 2023-03-14 ENCOUNTER — Emergency Department (HOSPITAL_COMMUNITY)
Admission: EM | Admit: 2023-03-14 | Discharge: 2023-03-14 | Disposition: A | Discharge status: Home / Self Care | Payer: Medicaid Other | Attending: Emergency Medicine | Admitting: Emergency Medicine

## 2023-03-14 ENCOUNTER — Other Ambulatory Visit: Payer: Self-pay

## 2023-03-14 DIAGNOSIS — Z23 Encounter for immunization: Secondary | ICD-10-CM | POA: Diagnosis not present

## 2023-03-14 DIAGNOSIS — F32A Depression, unspecified: Secondary | ICD-10-CM | POA: Diagnosis not present

## 2023-03-14 DIAGNOSIS — X789XXA Intentional self-harm by unspecified sharp object, initial encounter: Secondary | ICD-10-CM | POA: Diagnosis not present

## 2023-03-14 DIAGNOSIS — S51812A Laceration without foreign body of left forearm, initial encounter: Secondary | ICD-10-CM | POA: Diagnosis present

## 2023-03-14 DIAGNOSIS — F430 Acute stress reaction: Secondary | ICD-10-CM

## 2023-03-14 LAB — CBC WITH DIFFERENTIAL/PLATELET
Abs Immature Granulocytes: 0.03 10*3/uL (ref 0.00–0.07)
Basophils Absolute: 0.1 10*3/uL (ref 0.0–0.1)
Basophils Relative: 1 %
Eosinophils Absolute: 0.7 10*3/uL — ABNORMAL HIGH (ref 0.0–0.5)
Eosinophils Relative: 8 %
HCT: 37.2 % (ref 36.0–46.0)
Hemoglobin: 12.5 g/dL (ref 12.0–15.0)
Immature Granulocytes: 0 %
Lymphocytes Relative: 24 %
Lymphs Abs: 2 10*3/uL (ref 0.7–4.0)
MCH: 32.4 pg (ref 26.0–34.0)
MCHC: 33.6 g/dL (ref 30.0–36.0)
MCV: 96.4 fL (ref 80.0–100.0)
Monocytes Absolute: 0.7 10*3/uL (ref 0.1–1.0)
Monocytes Relative: 8 %
Neutro Abs: 4.9 10*3/uL (ref 1.7–7.7)
Neutrophils Relative %: 59 %
Platelets: 225 10*3/uL (ref 150–400)
RBC: 3.86 MIL/uL — ABNORMAL LOW (ref 3.87–5.11)
RDW: 12.2 % (ref 11.5–15.5)
WBC: 8.4 10*3/uL (ref 4.0–10.5)
nRBC: 0 % (ref 0.0–0.2)

## 2023-03-14 LAB — RAPID URINE DRUG SCREEN, HOSP PERFORMED
Amphetamines: NOT DETECTED
Barbiturates: NOT DETECTED
Benzodiazepines: NOT DETECTED
Cocaine: NOT DETECTED
Opiates: NOT DETECTED
Tetrahydrocannabinol: POSITIVE — AB

## 2023-03-14 LAB — COMPREHENSIVE METABOLIC PANEL
ALT: 14 U/L (ref 0–44)
AST: 18 U/L (ref 15–41)
Albumin: 3.9 g/dL (ref 3.5–5.0)
Alkaline Phosphatase: 53 U/L (ref 38–126)
Anion gap: 13 (ref 5–15)
BUN: 10 mg/dL (ref 6–20)
CO2: 18 mmol/L — ABNORMAL LOW (ref 22–32)
Calcium: 8.8 mg/dL — ABNORMAL LOW (ref 8.9–10.3)
Chloride: 109 mmol/L (ref 98–111)
Creatinine, Ser: 0.93 mg/dL (ref 0.44–1.00)
GFR, Estimated: >60 mL/min (ref >60)
Glucose, Bld: 87 mg/dL (ref 70–99)
Potassium: 3.8 mmol/L (ref 3.5–5.1)
Sodium: 140 mmol/L (ref 135–145)
Total Bilirubin: 0.4 mg/dL (ref 0.3–1.2)
Total Protein: 7.2 g/dL (ref 6.5–8.1)

## 2023-03-14 LAB — ETHANOL: Alcohol, Ethyl (B): 37 mg/dL — ABNORMAL HIGH (ref <10)

## 2023-03-14 LAB — SALICYLATE LEVEL: Salicylate Lvl: <7 mg/dL — ABNORMAL LOW (ref 7.0–30.0)

## 2023-03-14 LAB — ACETAMINOPHEN LEVEL: Acetaminophen (Tylenol), Serum: <10 ug/mL — ABNORMAL LOW (ref 10–30)

## 2023-03-14 MED ORDER — QUETIAPINE FUMARATE 100 MG PO TABS
200.0000 mg | ORAL_TABLET | Freq: Two times a day (BID) | ORAL | Status: DC
  Filled 2023-03-14: qty 2

## 2023-03-14 MED ORDER — TETANUS-DIPHTH-ACELL PERTUSSIS 5-2.5-18.5 LF-MCG/0.5 IM SUSY
0.5000 mL | PREFILLED_SYRINGE | Freq: Once | INTRAMUSCULAR | Status: AC
  Administered 2023-03-14: 0.5 mL via INTRAMUSCULAR
  Filled 2023-03-14: qty 0.5

## 2023-03-14 MED ORDER — FAMOTIDINE 20 MG PO TABS
20.0000 mg | ORAL_TABLET | Freq: Two times a day (BID) | ORAL | Status: DC
  Filled 2023-03-14: qty 1

## 2023-03-14 MED ORDER — HYDROXYZINE HCL 25 MG PO TABS: 25.0000 mg | ORAL_TABLET | Freq: Four times a day (QID) | ORAL | Status: DC | PRN

## 2023-03-14 MED ORDER — LISINOPRIL-HYDROCHLOROTHIAZIDE 20-25 MG PO TABS: 1.0000 | ORAL_TABLET | Freq: Every day | ORAL | Status: DC

## 2023-03-14 MED ORDER — METOCLOPRAMIDE HCL 5 MG/ML IJ SOLN
5.0000 mg | Freq: Once | INTRAMUSCULAR | Status: AC
  Administered 2023-03-14: 5 mg via INTRAMUSCULAR
  Filled 2023-03-14: qty 2

## 2023-03-14 MED ORDER — DIPHENHYDRAMINE HCL 50 MG/ML IJ SOLN
25.0000 mg | Freq: Once | INTRAMUSCULAR | Status: AC
  Administered 2023-03-14: 25 mg via INTRAMUSCULAR
  Filled 2023-03-14: qty 1

## 2023-03-14 MED ORDER — KETOROLAC TROMETHAMINE 10 MG PO TABS: 10.0000 mg | ORAL_TABLET | Freq: Four times a day (QID) | ORAL | Status: DC | PRN

## 2023-03-14 NOTE — ED Provider Notes (Addendum)
[place] EMERGENCY DEPARTMENT AT [facility] Provider Note   CSN: [id number] Arrival date & time: 03/14/23  2952     History  Chief Complaint  Patient presents with   Suicidal    Connie Black is a 57 y.o. female.  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  States she has a history of cutting in the past.  Notes increased stress recently.  Denies that this was a suicide attempt.  States that she has not slept the last 48 hours.  Denies any intentional ingestions at this time.  Has had her psychiatric medications adjusted recently.  States that none of her personalities told her to do this.  States she has about 18 and she keeps them asleep.  Patient told her husband about her acute stress and when she did with her forearm and he called 911 and she presents via EMS       Home Medications Prior to Admission medications   Medication Sig Start Date End Date Taking? Authorizing Provider  EPINEPHrine (EPIPEN 2-PAK) 0.3 mg/0.3 mL IJ SOAJ injection Inject 0.3 mg into the muscle as needed for anaphylaxis. 11/01/20   Joy, Shawn C, PA-C  famotidine (PEPCID) 20 MG tablet Take 1 tablet (20 mg total) by mouth 2 (two) times daily for 3 days. 11/01/20 11/04/20  Joy, Shawn C, PA-C  hydrOXYzine (ATARAX/VISTARIL) 25 MG tablet Take 1 tablet (25 mg total) by mouth every 6 (six) hours as needed for itching. 11/01/20   Joy, Shawn C, PA-C  ketorolac (TORADOL) 10 MG tablet Take 1 tablet (10 mg total) by mouth every 6 (six) hours as needed. 02/27/22   Cheryll Cockayne, MD  lidocaine (LIDODERM) 5 % Place 1 patch onto the skin daily as needed. Apply patch to area most significant pain once per day.  Remove and discard patch within 12 hours of application. 07/01/20   Petrucelli, Samantha R, PA-C  lisinopril-hydrochlorothiazide (ZESTORETIC) 20-25 MG tablet Take 1 tablet by mouth daily.    [provider]  naproxen (NAPROSYN)  500 MG tablet Take 1 tablet (500 mg total) by mouth 2 (two) times daily as needed for moderate pain. 07/01/20   Petrucelli, Samantha R, PA-C  naratriptan (AMERGE) 2.5 MG tablet Take 2.5 mg by mouth as needed for migraine. Take one (1) tablet at onset of headache; if returns or does not resolve, may repeat after 4 hours; do not exceed five (5) mg in 24 hours.    [provider]  prochlorperazine (COMPAZINE) 10 MG tablet Take 1 tablet (10 mg total) by mouth 2 (two) times daily as needed for up to 6 doses for nausea or vomiting (or Headaches). 02/27/22   Cheryll Cockayne, MD  QUEtiapine (SEROQUEL) 200 MG tablet Take 1 tablet (200 mg total) by mouth 2 (two) times daily. 10/06/19   Rankin, Shuvon B, NP      Allergies    Morphine    Review of Systems   Review of Systems  All other systems reviewed and are negative.   Physical Exam Updated Vital Signs BP (!) 148/90 (BP Location: Left Arm)   Pulse 79   Temp 98.2 F (36.8 C) (Oral)   Resp 16   LMP 11/03/2011   SpO2 100%  Physical Exam Vitals and nursing note reviewed.  Constitutional:      General: She is not in acute distress.    Appearance: Normal appearance. She is well-developed. She is not toxic-appearing.  HENT:     Head: Normocephalic and atraumatic.  Eyes:     General: Lids are normal.     Conjunctiva/sclera: Conjunctivae normal.     Pupils: Pupils are equal, round, and reactive to light.  Neck:     Thyroid: No thyroid mass.     Trachea: No tracheal deviation.  Cardiovascular:     Rate and Rhythm: Normal rate and regular rhythm.     Heart sounds: Normal heart sounds. No murmur heard.    No gallop.  Pulmonary:     Effort: Pulmonary effort is normal. No respiratory distress.     Breath sounds: Normal breath sounds. No stridor. No decreased breath sounds, wheezing, rhonchi or rales.  Abdominal:     General: There is no distension.     Palpations: Abdomen is soft.     Tenderness: There is no abdominal tenderness. There  is no rebound.  Musculoskeletal:        General: No tenderness. Normal range of motion.       Arms:     Cervical back: Normal range of motion and neck supple.  Skin:    General: Skin is warm and dry.     Findings: No abrasion or rash.  Neurological:     Mental Status: She is alert and oriented to person, place, and time. Mental status is at baseline.     GCS: GCS eye subscore is 4. GCS verbal subscore is 5. GCS motor subscore is 6.     Cranial Nerves: Cranial nerves are intact. No cranial nerve deficit.     Sensory: No sensory deficit.     Motor: Motor function is intact.  Psychiatric:        Attention and Perception: Attention normal.        Mood and Affect: Mood is depressed.        Speech: Speech normal.        Behavior: Behavior is withdrawn.        Thought Content: Thought content does not include homicidal or suicidal ideation.     ED Results / Procedures / Treatments   Labs (all labs ordered are listed, but only abnormal results are displayed) Labs Reviewed  SALICYLATE LEVEL  ACETAMINOPHEN LEVEL  ETHANOL  RAPID URINE DRUG SCREEN, HOSP PERFORMED  COMPREHENSIVE METABOLIC PANEL  CBC WITH DIFFERENTIAL/PLATELET    EKG None  Radiology No results found.  Procedures Procedures    Medications Ordered in ED Medications  Tdap (BOOSTRIX) injection 0.5 mL (has no administration in time range)    ED Course/ Medical Decision Making/ A&P                             Medical Decision Making Amount and/or Complexity of Data Reviewed Labs: ordered.  Risk Prescription drug management.   Patient's tetanus status updated here.  Patient superficial lacerations which were treated with Steri-Strips placed by nursing staff.  Patient is medically cleared at this time for psychiatric disposition  11:50 AM Spoke with husband at length about patient's current condition.  He is serving as collateral information.  He agrees that patient is not suicidal at this time.  Patient  states that she has history of cutting to release the stress that she feels.  Has been to a lot of stress due to family issues.  Husband feels comfortable taking her home and have him follow-up with her psychiatrist.        Final Clinical Impression(s) /  ED Diagnoses Final diagnoses:  None    Rx / DC Orders ED Discharge Orders     None         Lorre Nick, MD 03/14/23 1018    Lorre Nick, MD 03/14/23 1151

## 2023-03-14 NOTE — ED Triage Notes (Signed)
Pt arrived from home via [facility]. She cut her wrist to reduce stress and pressure of how she was feeling. Her medication was changed 7 days ago.   BP 140/80 HR 80 O2 99%RA

## 2023-03-14 NOTE — ED Notes (Signed)
Pt stated that since she wasn't trying to kill herself, she was just "trying to release", she was wanting to leave and being back in [facility] was making her anxious.

## 2023-03-14 NOTE — Discharge Instructions (Signed)
Follow-up with your psychiatrist next week

## 2023-03-14 NOTE — Consult Note (Signed)
Reached out to see patient for psych assessment via tts cart.  Was informed by the Domingo Mend, paramedic caring for patient the ED provider is discharging her and TTS is no longer needed.

## 2023-03-14 NOTE — ED Notes (Signed)
Pt belonging bag beside nurse station HALL C . 1 bag slippers and dress.

## 2023-09-12 ENCOUNTER — Other Ambulatory Visit: Payer: Self-pay | Admitting: Physician Assistant

## 2023-09-12 DIAGNOSIS — Z1231 Encounter for screening mammogram for malignant neoplasm of breast: Secondary | ICD-10-CM

## 2023-09-24 ENCOUNTER — Ambulatory Visit
Admission: RE | Admit: 2023-09-24 | Discharge: 2023-09-24 | Disposition: A | Discharge status: Home / Self Care | Payer: Medicaid Other | Source: Ambulatory Visit | Attending: Physician Assistant | Admitting: Physician Assistant

## 2023-09-24 DIAGNOSIS — Z1231 Encounter for screening mammogram for malignant neoplasm of breast: Secondary | ICD-10-CM

## 2023-10-10 ENCOUNTER — Other Ambulatory Visit: Payer: Self-pay

## 2023-10-10 ENCOUNTER — Emergency Department (HOSPITAL_COMMUNITY)
Admission: EM | Admit: 2023-10-10 | Discharge: 2023-10-11 | Disposition: A | Discharge status: Other Acute Inpatient Hospital | Payer: Medicaid Other | Attending: Emergency Medicine | Admitting: Emergency Medicine

## 2023-10-10 DIAGNOSIS — F5025 Bulimia nervosa, in remission: Secondary | ICD-10-CM | POA: Diagnosis not present

## 2023-10-10 DIAGNOSIS — F502 Bulimia nervosa, unspecified: Secondary | ICD-10-CM | POA: Diagnosis not present

## 2023-10-10 DIAGNOSIS — T1491XA Suicide attempt, initial encounter: Secondary | ICD-10-CM | POA: Diagnosis present

## 2023-10-10 DIAGNOSIS — F4481 Dissociative identity disorder: Secondary | ICD-10-CM | POA: Insufficient documentation

## 2023-10-10 DIAGNOSIS — F1420 Cocaine dependence, uncomplicated: Secondary | ICD-10-CM | POA: Diagnosis not present

## 2023-10-10 DIAGNOSIS — F315 Bipolar disorder, current episode depressed, severe, with psychotic features: Secondary | ICD-10-CM | POA: Diagnosis not present

## 2023-10-10 DIAGNOSIS — F17200 Nicotine dependence, unspecified, uncomplicated: Secondary | ICD-10-CM | POA: Insufficient documentation

## 2023-10-10 DIAGNOSIS — S1191XA Laceration without foreign body of unspecified part of neck, initial encounter: Secondary | ICD-10-CM | POA: Insufficient documentation

## 2023-10-10 DIAGNOSIS — R45851 Suicidal ideations: Secondary | ICD-10-CM

## 2023-10-10 DIAGNOSIS — F1220 Cannabis dependence, uncomplicated: Secondary | ICD-10-CM | POA: Diagnosis not present

## 2023-10-10 DIAGNOSIS — F4312 Post-traumatic stress disorder, chronic: Secondary | ICD-10-CM | POA: Insufficient documentation

## 2023-10-10 DIAGNOSIS — X781XXA Intentional self-harm by knife, initial encounter: Secondary | ICD-10-CM | POA: Diagnosis not present

## 2023-10-10 DIAGNOSIS — S199XXA Unspecified injury of neck, initial encounter: Secondary | ICD-10-CM | POA: Diagnosis present

## 2023-10-10 DIAGNOSIS — Y906 Blood alcohol level of 120-199 mg/100 ml: Secondary | ICD-10-CM | POA: Insufficient documentation

## 2023-10-10 LAB — CBC WITH DIFFERENTIAL/PLATELET
Abs Immature Granulocytes: 0 10*3/uL (ref 0.00–0.07)
Basophils Absolute: 0.1 10*3/uL (ref 0.0–0.1)
Basophils Relative: 1 %
Eosinophils Absolute: 1.6 10*3/uL — ABNORMAL HIGH (ref 0.0–0.5)
Eosinophils Relative: 12 %
HCT: 39.2 % (ref 36.0–46.0)
Hemoglobin: 13.4 g/dL (ref 12.0–15.0)
Lymphocytes Relative: 25 %
Lymphs Abs: 3.3 10*3/uL (ref 0.7–4.0)
MCH: 32.4 pg (ref 26.0–34.0)
MCHC: 34.2 g/dL (ref 30.0–36.0)
MCV: 94.9 fL (ref 80.0–100.0)
Monocytes Absolute: 0.7 10*3/uL (ref 0.1–1.0)
Monocytes Relative: 5 %
Neutro Abs: 7.6 10*3/uL (ref 1.7–7.7)
Neutrophils Relative %: 57 %
Platelets: 208 10*3/uL (ref 150–400)
RBC: 4.13 MIL/uL (ref 3.87–5.11)
RDW: 12.1 % (ref 11.5–15.5)
WBC: 13.3 10*3/uL — ABNORMAL HIGH (ref 4.0–10.5)
nRBC: 0 % (ref 0.0–0.2)
nRBC: 0 /100{WBCs}

## 2023-10-10 LAB — HCG, SERUM, QUALITATIVE: Preg, Serum: NEGATIVE

## 2023-10-10 LAB — BASIC METABOLIC PANEL
Anion gap: 16 — ABNORMAL HIGH (ref 5–15)
BUN: 6 mg/dL (ref 6–20)
CO2: 26 mmol/L (ref 22–32)
Calcium: 10.2 mg/dL (ref 8.9–10.3)
Chloride: 102 mmol/L (ref 98–111)
Creatinine, Ser: 1.08 mg/dL — ABNORMAL HIGH (ref 0.44–1.00)
GFR, Estimated: 60 mL/min — ABNORMAL LOW (ref >60)
Glucose, Bld: 69 mg/dL — ABNORMAL LOW (ref 70–99)
Potassium: 2.7 mmol/L — CL (ref 3.5–5.1)
Sodium: 144 mmol/L (ref 135–145)

## 2023-10-10 LAB — ACETAMINOPHEN LEVEL: Acetaminophen (Tylenol), Serum: <10 ug/mL — ABNORMAL LOW (ref 10–30)

## 2023-10-10 LAB — SALICYLATE LEVEL: Salicylate Lvl: <7 mg/dL — ABNORMAL LOW (ref 7.0–30.0)

## 2023-10-10 LAB — ETHANOL: Alcohol, Ethyl (B): 121 mg/dL — ABNORMAL HIGH (ref <10)

## 2023-10-10 MED ORDER — STERILE WATER FOR INJECTION IJ SOLN
INTRAMUSCULAR | Status: AC
  Administered 2023-10-10: 1.2 mL
  Filled 2023-10-10: qty 10

## 2023-10-10 MED ORDER — POTASSIUM CHLORIDE 10 MEQ/100ML IV SOLN
10.0000 meq | INTRAVENOUS | Status: AC
  Administered 2023-10-10 – 2023-10-11 (×2): 10 meq via INTRAVENOUS
  Filled 2023-10-10 (×2): qty 100

## 2023-10-10 MED ORDER — LORAZEPAM 1 MG PO TABS: 0.0000 mg | ORAL_TABLET | Freq: Four times a day (QID) | ORAL | Status: DC

## 2023-10-10 MED ORDER — POTASSIUM CHLORIDE 10 MEQ/100ML IV SOLN: 10.0000 meq | INTRAVENOUS | Status: AC

## 2023-10-10 MED ORDER — ZIPRASIDONE MESYLATE 20 MG IM SOLR
20.0000 mg | INTRAMUSCULAR | Status: DC | PRN
  Administered 2023-10-10: 20 mg via INTRAMUSCULAR
  Filled 2023-10-10: qty 20

## 2023-10-10 MED ORDER — THIAMINE HCL 100 MG/ML IJ SOLN: 100.0000 mg | Freq: Every day | INTRAMUSCULAR | Status: DC

## 2023-10-10 MED ORDER — LORAZEPAM 2 MG/ML IJ SOLN: 0.0000 mg | Freq: Four times a day (QID) | INTRAMUSCULAR | Status: DC

## 2023-10-10 MED ORDER — THIAMINE MONONITRATE 100 MG PO TABS
100.0000 mg | ORAL_TABLET | Freq: Every day | ORAL | Status: DC
  Administered 2023-10-11: 100 mg via ORAL
  Filled 2023-10-10: qty 1

## 2023-10-10 MED ORDER — SODIUM CHLORIDE 0.9% FLUSH: 10.0000 mL | INTRAVENOUS | Status: DC | PRN

## 2023-10-10 MED ORDER — POTASSIUM CHLORIDE CRYS ER 20 MEQ PO TBCR
40.0000 meq | EXTENDED_RELEASE_TABLET | Freq: Once | ORAL | Status: AC
  Administered 2023-10-10: 40 meq via ORAL
  Filled 2023-10-10: qty 2

## 2023-10-10 MED ORDER — MAGNESIUM SULFATE IN D5W 1-5 GM/100ML-% IV SOLN
1.0000 g | Freq: Once | INTRAVENOUS | Status: AC
  Administered 2023-10-10: 1 g via INTRAVENOUS
  Filled 2023-10-10: qty 100

## 2023-10-10 MED ORDER — SODIUM CHLORIDE 0.9% FLUSH
10.0000 mL | Freq: Two times a day (BID) | INTRAVENOUS | Status: DC
  Administered 2023-10-10: 10 mL

## 2023-10-10 MED ORDER — LORAZEPAM 2 MG/ML IJ SOLN: 0.0000 mg | Freq: Two times a day (BID) | INTRAMUSCULAR | Status: DC

## 2023-10-10 MED ORDER — LORAZEPAM 1 MG PO TABS: 0.0000 mg | ORAL_TABLET | Freq: Two times a day (BID) | ORAL | Status: DC

## 2023-10-10 NOTE — ED Notes (Signed)
Lab called a critical Potassium 2.7 at 1721 and Dr Lynelle Doctor was notified at 1735 with result.

## 2023-10-10 NOTE — ED Notes (Addendum)
Patient agreed to take PO potassium, however refused to let this RN start IV for IV medication. Refusing to be hooked up to monitor.

## 2023-10-10 NOTE — ED Notes (Signed)
Patient clothes is in locker 3. Pandora bracelet and nose ring is in security. Patient still have the tongue ring in, was not able to remove it

## 2023-10-10 NOTE — BH Assessment (Signed)
TTS clinician attempted to complete TTS assessment. Marcello Moores, RN, patient is not oriented due to receiving Geodon. TTS will attempt at later time.

## 2023-10-10 NOTE — ED Notes (Signed)
Patient yelling. Attempted to leave unit.

## 2023-10-10 NOTE — ED Provider Notes (Signed)
 Signout received on this 58 year old female who presented for concern of suicide attempt.  Workup pending at the time of shift change.  Physical Exam  BP (!) 154/93 (BP Location: Right Arm)   Pulse 73   Temp 98.4 F (36.9 C) (Oral)   Resp (!) 21   Ht 5\' 7"  (1.702 m)   Wt 46.3 kg   LMP 11/03/2011   SpO2 96%   BMI 15.98 kg/m     Procedures  Procedures  ED Course / MDM    Medical Decision Making Amount and/or Complexity of Data Reviewed Labs: ordered.  Risk Prescription drug management.   Patient threatening to leave.  IVC form filed. BMP with potassium of 2.7 otherwise without acute concern.  Received IV and p.o. potassium.  CBC with mild leukocytosis with left shift.  Alcohol level mildly elevated at 121.  Acetaminophen level, salicylate level within normal.  Pregnancy test negative.  UA, UDS collected and pending at this time.  Patient signed out to default provider.  Pending TTS consult at this time.       Marita Kansas, PA-C 10/10/23 2321    Linwood Dibbles, MD 10/11/23 [phone]

## 2023-10-10 NOTE — ED Triage Notes (Signed)
Pt BIB [facility]  from home, because of a suicide attempt today. Patient slit her throat today with a knife, which the area is superficial. Patient said she drink one  IPA voodoo  beer today. A & O x 4. Vital sign: BP 152/91, HR 65, RR 16, CBG 111, saturation 98% RA.

## 2023-10-10 NOTE — ED Provider Notes (Addendum)
 [place] EMERGENCY DEPARTMENT AT [facility] Provider Note   CSN: [id number] Arrival date & time: 10/10/23  1357     History  Chief Complaint  Patient presents with   Suicide Attempt    Connie Black is a 58 y.o. female past medical history significant for bipolar 1, polysubstance abuse, migraine headache, and tobacco dependence presents today after a suicide attempt.  Patient states that she attempted to slit her throat today with a knife.  Patient states she has had 1 beer today.  Patient endorses a headache but denies any chest pain, shortness of breath, nausea, vomiting, abdominal pain, drug use, or vision changes.  When asked patient if she had suicidal ideations or plans she states "no I am not suicidal I just want to cut my throat and end it"  HPI     Home Medications Prior to Admission medications   Medication Sig Start Date End Date Taking? Authorizing Provider  EPINEPHrine (EPIPEN 2-PAK) 0.3 mg/0.3 mL IJ SOAJ injection Inject 0.3 mg into the muscle as needed for anaphylaxis. 11/01/20   Joy, Shawn C, PA-C  famotidine (PEPCID) 20 MG tablet Take 1 tablet (20 mg total) by mouth 2 (two) times daily for 3 days. 11/01/20 11/04/20  Joy, Shawn C, PA-C  hydrOXYzine (ATARAX/VISTARIL) 25 MG tablet Take 1 tablet (25 mg total) by mouth every 6 (six) hours as needed for itching. 11/01/20   Joy, Shawn C, PA-C  ketorolac (TORADOL) 10 MG tablet Take 1 tablet (10 mg total) by mouth every 6 (six) hours as needed. 02/27/22   Cheryll Cockayne, MD  lidocaine (LIDODERM) 5 % Place 1 patch onto the skin daily as needed. Apply patch to area most significant pain once per day.  Remove and discard patch within 12 hours of application. 07/01/20   Petrucelli, Samantha R, PA-C  lisinopril-hydrochlorothiazide (ZESTORETIC) 20-25 MG tablet Take 1 tablet by mouth daily.    [provider]  naproxen (NAPROSYN) 500 MG tablet Take 1 tablet (500 mg total) by mouth 2 (two) times daily as needed for  moderate pain. 07/01/20   Petrucelli, Samantha R, PA-C  naratriptan (AMERGE) 2.5 MG tablet Take 2.5 mg by mouth as needed for migraine. Take one (1) tablet at onset of headache; if returns or does not resolve, may repeat after 4 hours; do not exceed five (5) mg in 24 hours.    [provider]  prochlorperazine (COMPAZINE) 10 MG tablet Take 1 tablet (10 mg total) by mouth 2 (two) times daily as needed for up to 6 doses for nausea or vomiting (or Headaches). 02/27/22   Cheryll Cockayne, MD  QUEtiapine (SEROQUEL) 200 MG tablet Take 1 tablet (200 mg total) by mouth 2 (two) times daily. 10/06/19   Rankin, Shuvon B, NP      Allergies    Morphine    Review of Systems   Review of Systems  Neurological:  Positive for headaches.    Physical Exam Updated Vital Signs Ht 5\' 7"  (1.702 m)   Wt 46.3 kg   LMP 11/03/2011   BMI 15.98 kg/m  Physical Exam Vitals and nursing note reviewed.  Constitutional:      General: She is not in acute distress.    Appearance: She is well-developed. She is not ill-appearing, toxic-appearing or diaphoretic.  HENT:     Head: Normocephalic and atraumatic.     Right Ear: External ear normal.     Left Ear: External ear normal.     Mouth/Throat:  Mouth: Mucous membranes are moist.     Pharynx: Oropharynx is clear.  Eyes:     Conjunctiva/sclera:     Left eye: Left conjunctiva is injected.     Pupils: Pupils are equal, round, and reactive to light.     Comments: Patient's left eye is injected, patient states that [facility].  Cardiovascular:     Rate and Rhythm: Normal rate and regular rhythm.     Pulses: Normal pulses.     Heart sounds: Normal heart sounds. No murmur heard. Pulmonary:     Effort: Pulmonary effort is normal. No respiratory distress.     Breath sounds: Normal breath sounds.  Abdominal:     Palpations: Abdomen is soft.     Tenderness: There is no abdominal tenderness.  Musculoskeletal:         General: No swelling.     Cervical back: Normal range of motion and neck supple. No rigidity.  Skin:    General: Skin is warm and dry.     Capillary Refill: Capillary refill takes less than 2 seconds.     Comments: Patient has multiple superficial lacerations to the anterior aspect of her neck with no bleeding noted.  Neurological:     Mental Status: She is alert and oriented to person, place, and time.  Psychiatric:        Mood and Affect: Mood normal. Affect is tearful.        Speech: Speech is tangential.        Behavior: Behavior is cooperative.        Thought Content: Thought content includes suicidal ideation. Thought content includes suicidal plan.     ED Results / Procedures / Treatments   Labs (all labs ordered are listed, but only abnormal results are displayed) Labs Reviewed  BASIC METABOLIC PANEL  ACETAMINOPHEN LEVEL  ETHANOL  SALICYLATE LEVEL  CBC WITH DIFFERENTIAL/PLATELET  URINALYSIS, ROUTINE W REFLEX MICROSCOPIC  RAPID URINE DRUG SCREEN, HOSP PERFORMED  HCG, SERUM, QUALITATIVE    EKG None  Radiology No results found.  Procedures Procedures    Medications Ordered in ED Medications - No data to display  ED Course/ Medical Decision Making/ A&P                                 Medical Decision Making Amount and/or Complexity of Data Reviewed Labs: ordered.   This patient presents to the ED with chief complaint(s) of suicide attempt with pertinent past medical history of bipolar 1, polysubstance abuse which further complicates the presenting complaint. The complaint involves an extensive differential diagnosis and also carries with it a high risk of complications and morbidity.    The differential diagnosis includes suicide attempt, intoxication, homicidal ideation, psychosis  Additional history obtained: Records reviewed Care Everywhere/External Records  ED Course and Reassessment:   Independent labs interpretation:  The following labs were  independently interpreted:  CBC: BMP: Acetaminophen level: Ethanol level: Salicylate level: UA: UDS: hCG serum: EKG: Sinus rhythm, abnormal R wave progression with early transition  Patient signed out to Marita Kansas, PA-C at shift change pending labs which will determine dispo.  Please refer to their note for complete results and findings.         Final Clinical Impression(s) / ED Diagnoses Final diagnoses:  None    Rx / DC Orders ED Discharge Orders     None  Dolphus Jenny, PA-C 10/10/23 1531    Dolphus Jenny, PA-C 10/10/23 1537    Benjiman Core, MD 10/11/23 [phone]

## 2023-10-10 NOTE — Progress Notes (Signed)

## 2023-10-10 NOTE — ED Notes (Signed)
IVC completed, 1 labeled in medrec, original in magistrate folder, 3 copies on purple clipboard in blue zone. [id number]

## 2023-10-10 NOTE — ED Notes (Signed)
Patient standing outside of room and refusing to sit back in bed.

## 2023-10-11 ENCOUNTER — Encounter (HOSPITAL_COMMUNITY): Payer: Self-pay | Admitting: Psychiatry

## 2023-10-11 DIAGNOSIS — T1491XA Suicide attempt, initial encounter: Secondary | ICD-10-CM | POA: Diagnosis not present

## 2023-10-11 DIAGNOSIS — F1420 Cocaine dependence, uncomplicated: Secondary | ICD-10-CM | POA: Diagnosis present

## 2023-10-11 DIAGNOSIS — F315 Bipolar disorder, current episode depressed, severe, with psychotic features: Secondary | ICD-10-CM | POA: Diagnosis present

## 2023-10-11 DIAGNOSIS — F1220 Cannabis dependence, uncomplicated: Secondary | ICD-10-CM | POA: Diagnosis present

## 2023-10-11 DIAGNOSIS — F4481 Dissociative identity disorder: Secondary | ICD-10-CM | POA: Diagnosis present

## 2023-10-11 DIAGNOSIS — F4312 Post-traumatic stress disorder, chronic: Secondary | ICD-10-CM | POA: Diagnosis present

## 2023-10-11 DIAGNOSIS — F502 Bulimia nervosa, unspecified: Secondary | ICD-10-CM | POA: Diagnosis present

## 2023-10-11 DIAGNOSIS — F5025 Bulimia nervosa, in remission: Secondary | ICD-10-CM

## 2023-10-11 LAB — COMPREHENSIVE METABOLIC PANEL
ALT: 16 U/L (ref 0–44)
AST: 27 U/L (ref 15–41)
Albumin: 3.6 g/dL (ref 3.5–5.0)
Alkaline Phosphatase: 46 U/L (ref 38–126)
Anion gap: 10 (ref 5–15)
BUN: 9 mg/dL (ref 6–20)
CO2: 24 mmol/L (ref 22–32)
Calcium: 9.2 mg/dL (ref 8.9–10.3)
Chloride: 109 mmol/L (ref 98–111)
Creatinine, Ser: 1.19 mg/dL — ABNORMAL HIGH (ref 0.44–1.00)
GFR, Estimated: 53 mL/min — ABNORMAL LOW (ref >60)
Glucose, Bld: 123 mg/dL — ABNORMAL HIGH (ref 70–99)
Potassium: 3.5 mmol/L (ref 3.5–5.1)
Sodium: 143 mmol/L (ref 135–145)
Total Bilirubin: 0.6 mg/dL (ref 0.0–1.2)
Total Protein: 6 g/dL — ABNORMAL LOW (ref 6.5–8.1)

## 2023-10-11 LAB — SARS CORONAVIRUS 2 BY RT PCR: SARS Coronavirus 2 by RT PCR: NEGATIVE

## 2023-10-11 MED ORDER — LAMOTRIGINE 100 MG PO TABS
100.0000 mg | ORAL_TABLET | Freq: Every day | ORAL | Status: DC
  Administered 2023-10-11: 100 mg via ORAL
  Filled 2023-10-11: qty 4

## 2023-10-11 MED ORDER — CLONAZEPAM 0.5 MG PO TABS: 0.5000 mg | ORAL_TABLET | Freq: Two times a day (BID) | ORAL | Status: DC | PRN

## 2023-10-11 MED ORDER — LUMATEPERONE TOSYLATE 42 MG PO CAPS
42.0000 mg | ORAL_CAPSULE | Freq: Every day | ORAL | Status: DC
  Administered 2023-10-11: 42 mg via ORAL
  Filled 2023-10-11: qty 1

## 2023-10-11 MED ORDER — MIRTAZAPINE 15 MG PO TABS: 45.0000 mg | ORAL_TABLET | Freq: Every day | ORAL | Status: DC

## 2023-10-11 MED ORDER — VENLAFAXINE HCL ER 75 MG PO CP24: 225.0000 mg | ORAL_CAPSULE | Freq: Every day | ORAL | Status: DC

## 2023-10-11 NOTE — ED Provider Notes (Signed)
 Emergency Medicine Observation Re-evaluation Note  Connie Black is a 58 y.o. female, seen on rounds today.  Pt initially presented to the ED for complaints of Suicide Attempt Currently, the patient is awaiting psychiatric hospitalization.  She is not currently under IVC, however psychiatry recommends IVC if she attempts to leave the hospital.  Patient has no concerns  Physical Exam  BP (!) 145/102 (BP Location: Left Arm)   Pulse 88   Temp 98.6 F (37 C)   Resp 18   Ht 5\' 7"  (1.702 m)   Wt 46.3 kg   LMP 11/03/2011   SpO2 100%   BMI 15.98 kg/m  Physical Exam General: Resting comfortably in bed, arouses to voice Lungs: Normal respiratory effort Psych: Calm, cooperative  ED Course / MDM  EKG:EKG Interpretation Date/Time:  Friday October 10 2023 14:06:52 EST Ventricular Rate:  76 PR Interval:  140 QRS Duration:  85 QT Interval:  395 QTC Calculation: 445 R Axis:   63  Text Interpretation: Sinus rhythm Abnormal R-wave progression, early transition Nonspecific T abnormalities, lateral leads Confirmed by Benjiman Core [phone]) on 10/11/2023 6:57:44 AM  I have reviewed the labs performed to date as well as medications administered while in observation.  Recent changes in the last 24 hours include potassium pending.  Plan  Current plan is for psychiatric admission.  Repeat potassium is normalized.    Laurence Spates, MD 10/11/23 2101
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
## 2023-10-11 NOTE — Progress Notes (Addendum)
 Pt has been accepted to [facility] on 10/11/2023 Bed assignment: 5 WEST   Pt meets inpatient criteria per: Arsenio Loader NP  Attending Physician will be: Norm Parcel MD   Report can be called to: [phone]  Pt can arrive after NEG COVID   Care Team Notified:Terry Andria Meuse NP, Oletta Cohn RN   [place] Amee Boothe LCSW-A   10/11/2023 1:40 PM

## 2023-10-11 NOTE — ED Notes (Signed)
 Psychiatry NP at bedside

## 2023-10-11 NOTE — Progress Notes (Signed)
 LCSW Progress Note  [id number]   Connie Black  10/11/2023  10:54 AM  Description:   Inpatient Psychiatric Referral  Patient was recommended inpatient per: Arsenio Loader NP   There are no available beds at [facility], per [facility] RN. Patient was referred to the following out of network facilities:    Destination  Service Provider Address Phone Fax  [facility] [address] [place] [address] [phone]  [facility] [address] [place], [place] [address] [phone]  [facility] [address] [place] [address] [phone]  [facility] [address], [place] [address] [phone]  [facility] [place] [address], [place] [address] [phone]  [facility] [address] [place] [address] [phone]  [facility] [address] [place] [address] [phone]  [facility] [address], [place] [address] [phone]  [facility] [place] [phone]  [facility] EFAX [address] [place], [place] [phone]  [facility] [address] [place] [address] [phone]  [facility] [address] [place] [address] [phone]  [facility] [address] [place] [address] [phone]      Situation ongoing, CSW to continue following and update chart as more information becomes available.      [place] Deondrea Markos, MSW, LCSW  10/11/2023 10:54 AM

## 2023-10-11 NOTE — ED Notes (Signed)
 3 belonging bags and 1 security envelop given to transport.

## 2023-10-11 NOTE — BH Assessment (Signed)
 Clinician messaged Laurelyn Sickle. Bonis, RN to see if the pt is alert and able to engage in TTS assessment. Per RN: "She is sleeping right now. She got some geodon earlier." Clinician asked RN to let her know when the pt is wake and she will complete her assessment.   Redmond Pulling, MS, [facility], [facility] Triage Specialist [phone]

## 2023-10-11 NOTE — ED Notes (Signed)
 Pt notified of pending transfer and was given the opportunity to call her family member.

## 2024-01-27 ENCOUNTER — Ambulatory Visit (INDEPENDENT_AMBULATORY_CARE_PROVIDER_SITE_OTHER): Payer: MEDICAID

## 2024-01-27 ENCOUNTER — Encounter (HOSPITAL_COMMUNITY): Payer: Self-pay

## 2024-01-27 DIAGNOSIS — F4312 Post-traumatic stress disorder, chronic: Secondary | ICD-10-CM

## 2024-01-27 DIAGNOSIS — F1994 Other psychoactive substance use, unspecified with psychoactive substance-induced mood disorder: Secondary | ICD-10-CM

## 2024-01-27 DIAGNOSIS — F1990 Other psychoactive substance use, unspecified, uncomplicated: Secondary | ICD-10-CM
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
## 2024-02-17 ENCOUNTER — Ambulatory Visit (INDEPENDENT_AMBULATORY_CARE_PROVIDER_SITE_OTHER): Payer: MEDICAID

## 2024-02-17 DIAGNOSIS — F1990 Other psychoactive substance use, unspecified, uncomplicated: Secondary | ICD-10-CM | POA: Diagnosis not present

## 2024-02-17 DIAGNOSIS — F1994 Other psychoactive substance use, unspecified with psychoactive substance-induced mood disorder: Secondary | ICD-10-CM

## 2024-02-17 DIAGNOSIS — F4312 Post-traumatic stress disorder, chronic: Secondary | ICD-10-CM
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
## 2024-03-12 ENCOUNTER — Encounter (HOSPITAL_COMMUNITY): Payer: Self-pay

## 2024-03-12 ENCOUNTER — Ambulatory Visit (HOSPITAL_COMMUNITY): Admitting: Psychiatry

## 2024-03-30 ENCOUNTER — Ambulatory Visit (HOSPITAL_COMMUNITY)

## 2024-03-30 ENCOUNTER — Encounter (HOSPITAL_COMMUNITY): Payer: Self-pay

## 2024-04-06 ENCOUNTER — Ambulatory Visit (HOSPITAL_COMMUNITY): Payer: Self-pay | Admitting: Mental Health
